# Patient Record
Sex: Male | Born: 1990 | Race: Black or African American | Hispanic: No | Marital: Single | Smoking: Never smoker
Health system: Southern US, Community
[De-identification: ages and names within clinical notes are randomized; demographics above are authoritative.]

## PROBLEM LIST (undated history)

## (undated) DIAGNOSIS — E669 Obesity, unspecified: Secondary | ICD-10-CM

## (undated) DIAGNOSIS — F840 Autistic disorder: Secondary | ICD-10-CM

## (undated) DIAGNOSIS — F952 Tourette's disorder: Secondary | ICD-10-CM

## (undated) DIAGNOSIS — I10 Essential (primary) hypertension: Secondary | ICD-10-CM

## (undated) HISTORY — DX: Tourette's disorder: F95.2

## (undated) HISTORY — DX: Essential (primary) hypertension: I10

## (undated) HISTORY — DX: Autistic disorder: F84.0

## (undated) HISTORY — DX: Obesity, unspecified: E66.9

---
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
## 2010-08-11 ENCOUNTER — Emergency Department (HOSPITAL_COMMUNITY)
Admission: EM | Admit: 2010-08-11 | Discharge: 2010-08-11 | Disposition: A | Discharge status: Home / Self Care | Payer: Medicaid Other | Attending: Emergency Medicine | Admitting: Emergency Medicine

## 2010-08-11 DIAGNOSIS — X58XXXA Exposure to other specified factors, initial encounter: Secondary | ICD-10-CM | POA: Insufficient documentation

## 2010-08-11 DIAGNOSIS — F840 Autistic disorder: Secondary | ICD-10-CM | POA: Insufficient documentation

## 2010-08-11 DIAGNOSIS — IMO0002 Reserved for concepts with insufficient information to code with codable children: Secondary | ICD-10-CM | POA: Insufficient documentation

## 2012-07-03 ENCOUNTER — Encounter: Payer: Self-pay | Admitting: *Deleted

## 2012-07-03 DIAGNOSIS — E669 Obesity, unspecified: Secondary | ICD-10-CM | POA: Insufficient documentation

## 2012-07-03 DIAGNOSIS — F840 Autistic disorder: Secondary | ICD-10-CM | POA: Insufficient documentation

## 2012-07-15 ENCOUNTER — Encounter: Payer: Self-pay | Admitting: Family Medicine

## 2013-05-19 ENCOUNTER — Encounter: Payer: Self-pay | Admitting: Family Medicine

## 2013-05-19 ENCOUNTER — Ambulatory Visit (INDEPENDENT_AMBULATORY_CARE_PROVIDER_SITE_OTHER): Payer: Medicaid Other | Admitting: Family Medicine

## 2013-05-19 VITALS — BP 126/74 | HR 80 | Temp 98.3°F | Resp 20 | Ht 75.0 in | Wt 326.0 lb

## 2013-05-19 DIAGNOSIS — Z0000 Encounter for general adult medical examination without abnormal findings: Secondary | ICD-10-CM

## 2013-05-19 MED ORDER — FLUTICASONE PROPIONATE 50 MCG/ACT NA SUSP: 2.0000 | Freq: Every day | NASAL | Status: DC

## 2013-05-19 NOTE — Progress Notes (Signed)
Subjective:    Patient ID: Erik Burton, male    DOB: [birth year], 23 y.o.   MRN: [id number]  HPI Is a 23 year old [place] male with a history of severe autism and morbid obesity. He also has a concerning family history for type 2 diabetes in both his mother and his twin sister. On his physical exam he also has acanthosis nigricans on his neck.  Otherwise he is doing well. Mom's only other concern is his allergies. He tolerates Flonase 2 sprays each nostril daily. She would prefer that over pills because it is difficult to get him to take pills. The patient is also morbidly obese weighing in at 326 pounds. His height has gone away would be approximately 210 pounds. At present he is eating large quantities of very cooperative food. He is also not getting any Regular exercise. Past Medical History  Diagnosis Date  . Autism   . Obesity    No current outpatient prescriptions on file prior to visit.   No current facility-administered medications on file prior to visit.   No Known Allergies History   Social History  . Marital Status: Single    Spouse Name: N/A    Number of Children: N/A  . Years of Education: N/A   Occupational History  . Not on file.   Social History Main Topics  . Smoking status: Never Smoker   . Smokeless tobacco: Not on file  . Alcohol Use: No  . Drug Use: Not on file  . Sexual Activity: Not on file   Other Topics Concern  . Not on file   Social History Narrative  . No narrative on file   Family History  Problem Relation Age of Onset  . Hypertension Mother   . Diabetes Mother   . Hyperlipidemia Mother   . Obesity Mother   . Hypertension Sister   . Hypertension Brother   . Diabetes Sister   . Obesity Sister       Review of Systems  All other systems reviewed and are negative.       Objective:   Physical Exam  Vitals reviewed. Constitutional: He is oriented to person, place, and time. He appears well-developed and  well-nourished. No distress.  HENT:  Right Ear: External ear normal.  Left Ear: External ear normal.  Nose: Nose normal.  Mouth/Throat: Oropharynx is clear and moist. No oropharyngeal exudate.  Eyes: Conjunctivae and EOM are normal. Pupils are equal, round, and reactive to light. Right eye exhibits no discharge. Left eye exhibits no discharge. No scleral icterus.  Neck: Normal range of motion. Neck supple. No JVD present. No thyromegaly present.  Cardiovascular: Normal rate, regular rhythm, normal heart sounds and intact distal pulses.  Exam reveals no gallop and no friction rub.   No murmur heard. Pulmonary/Chest: Effort normal and breath sounds normal. No stridor. No respiratory distress. He has no wheezes. He has no rales. He exhibits no tenderness.  Abdominal: Soft. Bowel sounds are normal. He exhibits no distension and no mass. There is no tenderness. There is no rebound and no guarding.  Musculoskeletal: Normal range of motion. He exhibits no edema and no tenderness.  Lymphadenopathy:    He has no cervical adenopathy.  Neurological: He is alert and oriented to person, place, and time. He has normal reflexes. He displays normal reflexes. No cranial nerve deficit. He exhibits normal muscle tone. Coordination normal.  Skin: Skin is warm. No rash noted. He is not diaphoretic. No erythema. No pallor.  Psychiatric: His affect is labile. He is slowed. Cognition and memory are impaired. He expresses impulsivity and inappropriate judgment. He is noncommunicative.          Assessment & Plan:  1. Routine general medical examination at a health care facility Patient's physical exam is significant for the skin changes on his neck, and his morbid obesity. Given his family history out of I could check a CBC, CMP, and fasting lipid panel. However the patient is autistic and becomes violent with any kind of immunizations or injections. Therefore we decided against this at his office visit. His mom  will try to get a fingerstick blood sugar at home and she is a diabetic and call me with the results.  If necessary we may try to give the patient Valium 10 mg 30 minutes prior to his lab draw and see if he will let us perform a venous stick to obtain the blood work in the future. I will await the results of mom's fingerstick blood sugar.

## 2013-06-23 ENCOUNTER — Telehealth: Payer: Self-pay | Admitting: Family Medicine

## 2013-06-23 NOTE — Telephone Encounter (Signed)
Pt mother is calling  To give us her sons sugar readings Wed morning 192 Thursday morning 143  Does the mother need to keep up with the sugar readings and does her son need an apt?  Call back number is [phone]

## 2013-06-26 NOTE — Telephone Encounter (Signed)
If those are fasting, they are very high.   I want a HgA1c (fingerstick here).

## 2013-06-28 NOTE — Telephone Encounter (Signed)
LMTRC

## 2013-07-24 ENCOUNTER — Encounter: Payer: Self-pay | Admitting: Family Medicine

## 2013-07-24 ENCOUNTER — Ambulatory Visit (INDEPENDENT_AMBULATORY_CARE_PROVIDER_SITE_OTHER): Payer: Medicaid Other | Admitting: Family Medicine

## 2013-07-24 VITALS — BP 120/72 | HR 80 | Temp 97.1°F | Resp 20 | Ht 75.0 in | Wt 322.0 lb

## 2013-07-24 DIAGNOSIS — R739 Hyperglycemia, unspecified: Secondary | ICD-10-CM

## 2013-07-24 DIAGNOSIS — F959 Tic disorder, unspecified: Secondary | ICD-10-CM

## 2013-07-24 DIAGNOSIS — R7309 Other abnormal glucose: Secondary | ICD-10-CM

## 2013-07-24 LAB — HEMOGLOBIN A1C, FINGERSTICK: HEMOGLOBIN A1C, FINGERSTICK: 5.2 % (ref <5.7)

## 2013-07-24 NOTE — Progress Notes (Signed)
Subjective:    Patient ID: Erik Burton, male    DOB: 06-Sep-1990, 23 y.o.   MRN: [id number]  HPI 05/19/13 Is a 23 year old [place] male with a history of severe autism and morbid obesity. He also has a concerning family history for type 2 diabetes in both his mother and his twin sister. On his physical exam he also has acanthosis nigricans on his neck.  Otherwise he is doing well. Mom's only other concern is his allergies. He tolerates Flonase 2 sprays each nostril daily. She would prefer that over pills because it is difficult to get him to take pills. The patient is also morbidly obese weighing in at 326 pounds. His height has gone away would be approximately 210 pounds. At present he is eating large quantities of very cooperative food. He is also not getting any Regular exercise.  At that time, my plan was: 1. Routine general medical examination at a health care facility Patient's physical exam is significant for the skin changes on his neck, and his morbid obesity. Given his family history out of I could check a CBC, CMP, and fasting lipid panel. However the patient is autistic and becomes violent with any kind of immunizations or injections. Therefore we decided against this at his office visit. His mom will try to get a fingerstick blood sugar at home and she is a diabetic and call me with the results.  If necessary we may try to give the patient Valium 10 mg 30 minutes prior to his lab draw and see if he will let us perform a venous stick to obtain the blood work in the future. I will await the results of mom's fingerstick blood sugar. 07/24/13 Patient is here today with his mother for followup.  mom has been checking fingerstick glucoses at home and recording values that range wildly. He is here today for a fingerstick hemoglobin A1c to assess to see if the patient truly a diabetic. His A1c today is 5.2 and normal. She also states that his verbal and physical tics have worsened. He  repeatedly makes quick, jerking motions of the head and his upper extremities. He also frequently repeats grunts and sounds inappropriately.    Past Medical History  Diagnosis Date  . Autism   . Obesity    Current Outpatient Prescriptions on File Prior to Visit  Medication Sig Dispense Refill  . fluticasone (FLONASE) 50 MCG/ACT nasal spray Place 2 sprays into both nostrils daily.  16 g  11   No current facility-administered medications on file prior to visit.   No Known Allergies History   Social History  . Marital Status: Single    Spouse Name: N/A    Number of Children: N/A  . Years of Education: N/A   Occupational History  . Not on file.   Social History Main Topics  . Smoking status: Never Smoker   . Smokeless tobacco: Not on file  . Alcohol Use: No  . Drug Use: Not on file  . Sexual Activity: Not on file   Other Topics Concern  . Not on file   Social History Narrative  . No narrative on file   Family History  Problem Relation Age of Onset  . Hypertension Mother   . Diabetes Mother   . Hyperlipidemia Mother   . Obesity Mother   . Hypertension Sister   . Hypertension Brother   . Diabetes Sister   . Obesity Sister       Review of  Systems  All other systems reviewed and are negative.       Objective:   Physical Exam  Vitals reviewed. Constitutional: He is oriented to person, place, and time. He appears well-developed and well-nourished. No distress.  HENT:  Right Ear: External ear normal.  Left Ear: External ear normal.  Nose: Nose normal.  Mouth/Throat: Oropharynx is clear and moist. No oropharyngeal exudate.  Eyes: Conjunctivae and EOM are normal. Pupils are equal, round, and reactive to light. Right eye exhibits no discharge. Left eye exhibits no discharge. No scleral icterus.  Neck: Normal range of motion. Neck supple. No JVD present. No thyromegaly present.  Cardiovascular: Normal rate, regular rhythm, normal heart sounds and intact distal  pulses.  Exam reveals no gallop and no friction rub.   No murmur heard. Pulmonary/Chest: Effort normal and breath sounds normal. No stridor. No respiratory distress. He has no wheezes. He has no rales. He exhibits no tenderness.  Abdominal: Soft. Bowel sounds are normal. He exhibits no distension and no mass. There is no tenderness. There is no rebound and no guarding.  Musculoskeletal: Normal range of motion. He exhibits no edema and no tenderness.  Lymphadenopathy:    He has no cervical adenopathy.  Neurological: He is alert and oriented to person, place, and time. He has normal reflexes. No cranial nerve deficit. He exhibits normal muscle tone. Coordination normal.  Skin: Skin is warm. No rash noted. He is not diaphoretic. No erythema. No pallor.  Psychiatric: His affect is labile. He is slowed. Cognition and memory are impaired. He expresses impulsivity and inappropriate judgment. He is noncommunicative.          Assessment & Plan:  1. Elevated blood sugar I explained the values to the patient and his mother. There is no sign of diabetes. His hemoglobin A1c is well within normal limits. Continue to encourage a low carbohydrate diet and increasing exercise. - Hemoglobin A1C, fingerstick  2. Tic disorder Patient's autism tends to have an element of Tourette's syndrome.  I would consider treating him with clonidine to help minimize some of the vertebral and motor tics. I gave the patient's mother information on the medication so she can research it. She will call me and by me know if she wants to try to start medication. We can also add an SSRI to the clonidine to help as well.  We could also consider an antipsychotic medication if his symptoms prove refractory.  Mom will consider all these options and that me know her decision.

## 2013-08-07 ENCOUNTER — Telehealth: Payer: Self-pay | Admitting: Family Medicine

## 2013-08-07 NOTE — Telephone Encounter (Signed)
Message copied by Ricard DillonWILLIS, Cannon Quinton B on Mon Aug 07, 2013 12:39 PM ------      Message from: Malvin JohnsBULLINS, SUSAN S      Created: Mon Aug 07, 2013 12:29 PM       Patients mom is calling to say that they want to go ahead and put patient on the clonidine if possible please call [phone] with questions   ------

## 2013-08-08 MED ORDER — CLONIDINE HCL ER 0.1 & 0.2 MG PO MISC: 0.1000 mg | Freq: Every day | ORAL | Status: DC

## 2013-08-08 NOTE — Telephone Encounter (Signed)
Begin kapvay 0.1 mg poqhs and call with results in 2 weeks, we can increase further at that time if needed.

## 2013-08-08 NOTE — Telephone Encounter (Signed)
Mother aware of RX and need to call provider in two weeks.

## 2013-10-03 ENCOUNTER — Telehealth: Payer: Self-pay | Admitting: Family Medicine

## 2013-10-03 NOTE — Telephone Encounter (Signed)
Okay to change to 0.2mg  at bedtime, have them com in for f/u with Dr. Tanya NonesPickard after a few weeks

## 2013-10-03 NOTE — Telephone Encounter (Signed)
He is taking .1mg  qhs and mom wants to increase it.  Per phone note 08/07/13 WTP states that we could up the dose. Ok to do?

## 2013-10-03 NOTE — Telephone Encounter (Signed)
Pt mothers states that  The dosage is needs to be more  CloNIDine HCl ER 0.1 & 0.2 MG MISC    [phone]  [facility]

## 2013-10-04 MED ORDER — CLONIDINE HCL 0.2 MG PO TABS: 0.2000 mg | ORAL_TABLET | Freq: Every day | ORAL | Status: DC

## 2013-10-04 NOTE — Telephone Encounter (Signed)
Med sent to pharm, mother aware and appt made

## 2013-10-16 ENCOUNTER — Ambulatory Visit (INDEPENDENT_AMBULATORY_CARE_PROVIDER_SITE_OTHER): Payer: Medicaid Other | Admitting: Family Medicine

## 2013-10-16 ENCOUNTER — Encounter: Payer: Self-pay | Admitting: Family Medicine

## 2013-10-16 VITALS — BP 120/74 | HR 84 | Temp 97.3°F | Resp 20 | Ht 75.0 in | Wt 316.0 lb

## 2013-10-16 DIAGNOSIS — F840 Autistic disorder: Secondary | ICD-10-CM

## 2013-10-16 DIAGNOSIS — F952 Tourette's disorder: Secondary | ICD-10-CM

## 2013-10-16 MED ORDER — FLUTICASONE PROPIONATE 50 MCG/ACT NA SUSP: 2.0000 | Freq: Every day | NASAL | Status: DC

## 2013-10-16 MED ORDER — CLONIDINE HCL 0.2 MG PO TABS: 0.2000 mg | ORAL_TABLET | Freq: Two times a day (BID) | ORAL | Status: DC

## 2013-10-16 NOTE — Progress Notes (Signed)
Subjective:    Patient ID: Erik Burton, male    DOB: [birth year], 23 y.o.   MRN: [id number]  HPI 05/19/13 Is a 23 year old [place] male with a history of severe autism and morbid obesity. He also has a concerning family history for type 2 diabetes in both his mother and his twin sister. On his physical exam he also has acanthosis nigricans on his neck.  Otherwise he is doing well. Mom's only other concern is his allergies. He tolerates Flonase 2 sprays each nostril daily. She would prefer that over pills because it is difficult to get him to take pills. The patient is also morbidly obese weighing in at 326 pounds. His height has gone away would be approximately 210 pounds. At present he is eating large quantities of very cooperative food. He is also not getting any Regular exercise.  At that time, my plan was: 1. Routine general medical examination at a health care facility Patient's physical exam is significant for the skin changes on his neck, and his morbid obesity. Given his family history out of I could check a CBC, CMP, and fasting lipid panel. However the patient is autistic and becomes violent with any kind of immunizations or injections. Therefore we decided against this at his office visit. His mom will try to get a fingerstick blood sugar at home and she is a diabetic and call me with the results.  If necessary we may try to give the patient Valium 10 mg 30 minutes prior to his lab draw and see if he will let us perform a venous stick to obtain the blood work in the future. I will await the results of mom's fingerstick blood sugar. 07/24/13 Patient is here today with his mother for followup.  mom has been checking fingerstick glucoses at home and recording values that range wildly. He is here today for a fingerstick hemoglobin A1c to assess to see if the patient truly a diabetic. His A1c today is 5.2 and normal. She also states that his verbal and physical tics have worsened. He  repeatedly makes quick, jerking motions of the head and his upper extremities. He also frequently repeats grunts and sounds inappropriately.  At that time, my plan was: 1. Elevated blood sugar I explained the values to the patient and his mother. There is no sign of diabetes. His hemoglobin A1c is well within normal limits. Continue to encourage a low carbohydrate diet and increasing exercise. - Hemoglobin A1C, fingerstick  2. Tic disorder Patient's autism tends to have an element of Tourette's syndrome.  I would consider treating him with clonidine to help minimize some of the vertebral and motor tics. I gave the patient's mother information on the medication so she can research it. She will call me and by me know if she wants to try to start medication. We can also add an SSRI to the clonidine to help as well.  We could also consider an antipsychotic medication if his symptoms prove refractory.  Mom will consider all these options and that me know her decision.   10/16/13 Patient was initially started on clonidine 0.1 mg by mouth each bedtime. This seemed to help calm her agitation at night. We'll subsequently increase clonidine to 0.2 mg by mouth each bedtime. This seems to help but it does not last throughout the day. He continues to have a lot of motor tics. He also continues to have a lot of verbal outburst. Mother is interested in other options for  controlling some of the symptoms of his autism disorder.  Past Medical History  Diagnosis Date  . Autism   . Obesity   . Tourette disorder    Current Outpatient Prescriptions on File Prior to Visit  Medication Sig Dispense Refill  . CloNIDine HCl ER 0.1 & 0.2 MG MISC Take 0.1 mg by mouth at bedtime.  30 each  0  . fluticasone (FLONASE) 50 MCG/ACT nasal spray Place 2 sprays into both nostrils daily.  16 g  11   No current facility-administered medications on file prior to visit.   No Known Allergies History   Social History  . Marital  Status: Single    Spouse Name: N/A    Number of Children: N/A  . Years of Education: N/A   Occupational History  . Not on file.   Social History Main Topics  . Smoking status: Never Smoker   . Smokeless tobacco: Not on file  . Alcohol Use: No  . Drug Use: Not on file  . Sexual Activity: Not on file   Other Topics Concern  . Not on file   Social History Narrative  . No narrative on file   Family History  Problem Relation Age of Onset  . Hypertension Mother   . Diabetes Mother   . Hyperlipidemia Mother   . Obesity Mother   . Hypertension Sister   . Hypertension Brother   . Diabetes Sister   . Obesity Sister       Review of Systems  All other systems reviewed and are negative.      Objective:   Physical Exam  Vitals reviewed. Constitutional: He is oriented to person, place, and time. He appears well-developed and well-nourished. No distress.  HENT:  Right Ear: External ear normal.  Left Ear: External ear normal.  Nose: Nose normal.  Mouth/Throat: Oropharynx is clear and moist. No oropharyngeal exudate.  Eyes: Conjunctivae and EOM are normal. Pupils are equal, round, and reactive to light. Right eye exhibits no discharge. Left eye exhibits no discharge. No scleral icterus.  Neck: Normal range of motion. Neck supple. No JVD present. No thyromegaly present.  Cardiovascular: Normal rate, regular rhythm, normal heart sounds and intact distal pulses.  Exam reveals no gallop and no friction rub.   No murmur heard. Pulmonary/Chest: Effort normal and breath sounds normal. No stridor. No respiratory distress. He has no wheezes. He has no rales. He exhibits no tenderness.  Abdominal: Soft. Bowel sounds are normal. He exhibits no distension and no mass. There is no tenderness. There is no rebound and no guarding.  Musculoskeletal: Normal range of motion. He exhibits no edema and no tenderness.  Lymphadenopathy:    He has no cervical adenopathy.  Neurological: He is alert  and oriented to person, place, and time. He has normal reflexes. No cranial nerve deficit. He exhibits normal muscle tone. Coordination normal.  Skin: Skin is warm. No rash noted. He is not diaphoretic. No erythema. No pallor.  Psychiatric: His affect is labile. He is slowed. Cognition and memory are impaired. He expresses impulsivity and inappropriate judgment. He is noncommunicative.          Assessment & Plan:  1. Tourette disorder - cloNIDine (CATAPRES) 0.2 MG tablet; Take 1 tablet (0.2 mg total) by mouth 2 (two) times daily.  Dispense: 60 tablet; Refill: 5  2. Autism disorder  Increase clonidine to 0.2 mg by mouth twice a day. Monitor for low blood pressure. Also discussed possibly trying the patient on risperdal.  However I would recommend a psychiatry consultation prior to initiating that medication.

## 2015-04-22 ENCOUNTER — Emergency Department (HOSPITAL_COMMUNITY): Payer: Medicaid Other

## 2015-04-22 ENCOUNTER — Encounter (HOSPITAL_COMMUNITY): Payer: Self-pay | Admitting: Emergency Medicine

## 2015-04-22 ENCOUNTER — Emergency Department (HOSPITAL_COMMUNITY)
Admission: EM | Admit: 2015-04-22 | Discharge: 2015-04-22 | Disposition: A | Discharge status: Home / Self Care | Payer: Medicaid Other | Attending: Emergency Medicine | Admitting: Emergency Medicine

## 2015-04-22 DIAGNOSIS — F840 Autistic disorder: Secondary | ICD-10-CM | POA: Diagnosis not present

## 2015-04-22 DIAGNOSIS — Y998 Other external cause status: Secondary | ICD-10-CM | POA: Insufficient documentation

## 2015-04-22 DIAGNOSIS — X58XXXA Exposure to other specified factors, initial encounter: Secondary | ICD-10-CM | POA: Insufficient documentation

## 2015-04-22 DIAGNOSIS — S99921A Unspecified injury of right foot, initial encounter: Secondary | ICD-10-CM | POA: Diagnosis present

## 2015-04-22 DIAGNOSIS — Z7951 Long term (current) use of inhaled steroids: Secondary | ICD-10-CM | POA: Insufficient documentation

## 2015-04-22 DIAGNOSIS — Z79899 Other long term (current) drug therapy: Secondary | ICD-10-CM | POA: Diagnosis not present

## 2015-04-22 DIAGNOSIS — S93401A Sprain of unspecified ligament of right ankle, initial encounter: Secondary | ICD-10-CM

## 2015-04-22 DIAGNOSIS — Y9389 Activity, other specified: Secondary | ICD-10-CM | POA: Insufficient documentation

## 2015-04-22 DIAGNOSIS — E669 Obesity, unspecified: Secondary | ICD-10-CM | POA: Insufficient documentation

## 2015-04-22 DIAGNOSIS — Y9289 Other specified places as the place of occurrence of the external cause: Secondary | ICD-10-CM | POA: Diagnosis not present

## 2015-04-22 MED ORDER — IBUPROFEN 800 MG PO TABS
800.0000 mg | ORAL_TABLET | Freq: Once | ORAL | Status: AC
  Administered 2015-04-22: 800 mg via ORAL
  Filled 2015-04-22: qty 1

## 2015-04-22 MED ORDER — IBUPROFEN 800 MG PO TABS: 800.0000 mg | ORAL_TABLET | Freq: Three times a day (TID) | ORAL | Status: DC

## 2015-04-22 MED ORDER — HYDROCODONE-ACETAMINOPHEN 5-325 MG PO TABS: ORAL_TABLET | ORAL | Status: DC

## 2015-04-22 NOTE — Discharge Instructions (Signed)
Ankle Sprain °An ankle sprain is an injury to the strong, fibrous tissues (ligaments) that hold your ankle bones together.  °HOME CARE  °· Put ice on your ankle for 1-2 days or as told by your doctor. °¨ Put ice in a plastic bag. °¨ Place a towel between your skin and the bag. °¨ Leave the ice on for 15-20 minutes at a time, every 2 hours while you are awake. °· Only take medicine as told by your doctor. °· Raise (elevate) your injured ankle above the level of your heart as much as possible for 2-3 days. °· Use crutches if your doctor tells you to. Slowly put your own weight on the affected ankle. Use the crutches until you can walk without pain. °· If you have a plaster splint: °¨ Do not rest it on anything harder than a pillow for 24 hours. °¨ Do not put weight on it. °¨ Do not get it wet. °¨ Take it off to shower or bathe. °· If given, use an elastic wrap or support stocking for support. Take the wrap off if your toes lose feeling (numb), tingle, or turn cold or blue. °· If you have an air splint: °¨ Add or let out air to make it comfortable. °¨ Take it off at night and to shower and bathe. °¨ Wiggle your toes and move your ankle up and down often while you are wearing it. °GET HELP IF: °· You have rapidly increasing bruising or puffiness (swelling). °· Your toes feel very cold. °· You lose feeling in your foot. °· Your medicine does not help your pain. °GET HELP RIGHT AWAY IF:  °· Your toes lose feeling (numb) or turn blue. °· You have severe pain that is increasing. °MAKE SURE YOU:  °· Understand these instructions. °· Will watch your condition. °· Will get help right away if you are not doing well or get worse. °  °This information is not intended to replace advice given to you by your health care provider. Make sure you discuss any questions you have with your health care provider. °  °Document Released: 09/23/2007 Document Revised: 04/27/2014 Document Reviewed: 10/19/2011 °Elsevier Interactive Patient  Education ©2016 [employer] ° °

## 2015-04-22 NOTE — ED Notes (Signed)
Mother states patient's right ankle started swelling yesterday. Denies injury. Patient has autism and is unable to answer questions. Patient ambulatory with slight limp at triage.

## 2015-04-25 ENCOUNTER — Other Ambulatory Visit: Payer: Self-pay | Admitting: Family Medicine

## 2015-04-25 NOTE — Telephone Encounter (Signed)
Refill appropriate and filled per protocol. 

## 2015-04-27 NOTE — ED Provider Notes (Signed)
CSN: [id number]     Arrival date & time 04/22/15  1846 History   First MD Initiated Contact with Patient 04/22/15 2015     Chief Complaint  Patient presents with  . Joint Swelling     (Consider location/radiation/quality/duration/timing/severity/associated sxs/prior Treatment) HPI   Erik Burton is a 25 y.o. male with hx of autism presents to the Emergency Department with his mother who states patient has been limping and complaining of right foot pain for one day.  Mother states that she noticed he was limping on the day prior to arrival.  She states that he wore a different pair of shoes recently.  She is unsure of known injury.  She has not given any medications or tried any  Therapies for his symtpoms     Past Medical History  Diagnosis Date  . Autism   . Obesity   . Tourette disorder    History reviewed. No pertinent past surgical history. Family History  Problem Relation Age of Onset  . Hypertension Mother   . Diabetes Mother   . Hyperlipidemia Mother   . Obesity Mother   . Hypertension Sister   . Hypertension Brother   . Diabetes Sister   . Obesity Sister    Social History  Substance Use Topics  . Smoking status: Never Smoker   . Smokeless tobacco: None  . Alcohol Use: No    Review of Systems  Unable to perform ROS: Patient nonverbal (pt has hx of autism)      Allergies  Review of patient's allergies indicates no known allergies.  Home Medications   Prior to Admission medications   Medication Sig Start Date End Date Taking? Authorizing Provider  cloNIDine (CATAPRES) 0.2 MG tablet TAKE 1 TABLET BY MOUTH TWICE DAILY. 04/25/15   Donita BrooksWarren T Pickard, MD  CloNIDine HCl ER 0.1 & 0.2 MG MISC Take 0.1 mg by mouth at bedtime. 08/08/13   Donita BrooksWarren T Pickard, MD  fluticasone (FLONASE) 50 MCG/ACT nasal spray Place 2 sprays into both nostrils daily. 05/19/13   Donita BrooksWarren T Pickard, MD  fluticasone (FLONASE) 50 MCG/ACT nasal spray Place 2 sprays into both nostrils daily.  10/16/13   Donita BrooksWarren T Pickard, MD  HYDROcodone-acetaminophen (NORCO/VICODIN) 5-325 MG tablet Take one tab po q 4-6 hrs prn pain 04/22/15   Regana Kemple, PA-C  ibuprofen (ADVIL,MOTRIN) 800 MG tablet Take 1 tablet (800 mg total) by mouth 3 (three) times daily. 04/22/15   Kiptyn Rafuse, PA-C   Pulse 81  Temp(Src)   Resp 24  Ht 6' 3.5" (1.918 m)  Wt 164.429 kg  BMI 44.70 kg/m2  SpO2 100% Physical Exam  Constitutional: He is oriented to person, place, and time. He appears well-developed and well-nourished. No distress.  HENT:  Head: Normocephalic and atraumatic.  Cardiovascular: Normal rate, regular rhythm and intact distal pulses.   Pulmonary/Chest: Effort normal and breath sounds normal.  Musculoskeletal: He exhibits tenderness. He exhibits no edema.  Pt grimacing and withdraws his foot on palpation of the lateral hind foot and lateral ankle.  No edema.   DP pulse is brisk,distal sensation intact.  No erythema, abrasion, bruising or bony deformity.  No proximal tenderness.  Neurological: He is alert and oriented to person, place, and time. He exhibits normal muscle tone. Coordination normal.  Skin: Skin is warm and dry.  Nursing note and vitals reviewed.   ED Course  Procedures (including critical care time) Labs Review Labs Reviewed - No data to display  Imaging Review Dg Ankle Complete  Right  04/22/2015  CLINICAL DATA:  Right ankle pain and swelling, acute onset. Initial encounter. EXAM: RIGHT ANKLE - COMPLETE 3+ VIEW COMPARISON:  None. FINDINGS: There is no evidence of fracture or dislocation. The ankle mortise is intact; the interosseous space is within normal limits. No talar tilt or subluxation is seen. The joint spaces are preserved. Soft tissue swelling is noted about the ankle. IMPRESSION: No evidence of fracture or dislocation. Electronically Signed   By: Roanna Raider M.D.   On: 04/22/2015 21:45   Dg Foot Complete Right  04/22/2015  CLINICAL DATA:  Redness and swelling to lateral  Rt foot x 2 days. Unsure of any injury. Pt did recently get new shoes. Hx autism EXAM: RIGHT FOOT COMPLETE - 3+ VIEW COMPARISON:  None. FINDINGS: No acute fracture or dislocation. No definite soft tissue swelling. No radiopaque foreign object. IMPRESSION: No acute osseous abnormality. Electronically Signed   By: Jeronimo Greaves M.D.   On: 04/22/2015 19:48    I have personally reviewed and evaluated these images and lab results as part of my medical decision-making.   EKG Interpretation None      MDM   Final diagnoses:  Ankle sprain, right, initial encounter    XR reassuring.  NV intact.  Compartments soft.    ASO applied.  Mother agrees to RICE therapy and close PMD or ortho f/u.  Also agrees to return here if worsening sx's   Pauline Aus, PA-C 04/27/15 0109  Bethann Berkshire, MD 04/29/15 [phone]

## 2015-04-29 MED FILL — Hydrocodone-Acetaminophen Tab 5-325 MG: ORAL | Qty: 6 | Status: AC

## 2015-07-26 ENCOUNTER — Encounter: Payer: Self-pay | Admitting: Family Medicine

## 2015-07-26 ENCOUNTER — Ambulatory Visit (INDEPENDENT_AMBULATORY_CARE_PROVIDER_SITE_OTHER): Payer: Medicaid Other | Admitting: Family Medicine

## 2015-07-26 VITALS — BP 126/82 | HR 86 | Temp 98.1°F | Resp 20 | Ht 75.0 in | Wt 361.0 lb

## 2015-07-26 DIAGNOSIS — R938 Abnormal findings on diagnostic imaging of other specified body structures: Secondary | ICD-10-CM | POA: Diagnosis not present

## 2015-07-26 DIAGNOSIS — F840 Autistic disorder: Secondary | ICD-10-CM

## 2015-07-26 DIAGNOSIS — H579 Unspecified disorder of eye and adnexa: Secondary | ICD-10-CM

## 2015-07-26 DIAGNOSIS — F952 Tourette's disorder: Secondary | ICD-10-CM | POA: Diagnosis not present

## 2015-07-26 MED ORDER — IBUPROFEN 800 MG PO TABS: 800.0000 mg | ORAL_TABLET | Freq: Three times a day (TID) | ORAL | Status: DC

## 2015-07-26 MED ORDER — CLONIDINE HCL 0.2 MG PO TABS: 0.2000 mg | ORAL_TABLET | Freq: Two times a day (BID) | ORAL | Status: DC

## 2015-07-26 MED ORDER — FLUTICASONE PROPIONATE 50 MCG/ACT NA SUSP: 2.0000 | Freq: Every day | NASAL | Status: DC

## 2015-07-29 ENCOUNTER — Encounter: Payer: Self-pay | Admitting: Family Medicine

## 2015-07-29 NOTE — Progress Notes (Signed)
Subjective:    Patient ID: Erik Burton, male    DOB: [birth year], 25 y.o.   MRN: [id number]  HPI 05/19/13 Is a 25 year old [place] American male with a history of severe autism and morbid obesity. He also has a concerning family history for type 2 diabetes in both his mother and his twin sister. On his physical exam he also has acanthosis nigricans on his neck.  Otherwise he is doing well. Mom's only other concern is his allergies. He tolerates Flonase 2 sprays each nostril daily. She would prefer that over pills because it is difficult to get him to take pills. The patient is also morbidly obese weighing in at 326 pounds. His goal weight would be approximately 210 pounds. At present he is eating large quantities of junk food. He is also not getting any Regular exercise.  At that time, my plan was: 1. Routine general medical examination at a health care facility Patient's physical exam is significant for the skin changes on his neck, and his morbid obesity. Given his family history out of I could check a CBC, CMP, and fasting lipid panel. However the patient is autistic and becomes violent with any kind of immunizations or injections. Therefore we decided against this at his office visit. His mom will try to get a fingerstick blood sugar at home and she is a diabetic and call me with the results.  If necessary we may try to give the patient Valium 10 mg 30 minutes prior to his lab draw and see if he will let us perform a venous stick to obtain the blood work in the future. I will await the results of mom's fingerstick blood sugar. 07/24/13 Patient is here today with his mother for followup.  mom has been checking fingerstick glucoses at home and recording values that range wildly. He is here today for a fingerstick hemoglobin A1c to assess to see if the patient truly a diabetic. His A1c today is 5.2 and normal. She also states that his verbal and physical tics have worsened. He repeatedly makes  quick, jerking motions of the head and his upper extremities. He also frequently repeats grunts and sounds inappropriately.  At that time, my plan was: 1. Elevated blood sugar I explained the values to the patient and his mother. There is no sign of diabetes. His hemoglobin A1c is well within normal limits. Continue to encourage a low carbohydrate diet and increasing exercise. - Hemoglobin A1C, fingerstick  2. Tic disorder Patient's autism tends to have an element of Tourette's syndrome.  I would consider treating him with clonidine to help minimize some of the vertebral and motor tics. I gave the patient's mother information on the medication so she can research it. She will call me and by me know if she wants to try to start medication. We can also add an SSRI to the clonidine to help as well.  We could also consider an antipsychotic medication if his symptoms prove refractory.  Mom will consider all these options and that me know her decision.   10/16/13 Patient was initially started on clonidine 0.1 mg by mouth each bedtime. This seemed to help calm her agitation at night. We'll subsequently increase clonidine to 0.2 mg by mouth each bedtime. This seems to help but it does not last throughout the day. He continues to have a lot of motor tics. He also continues to have a lot of verbal outburst. Mother is interested in other options for controlling some of  the symptoms of his autism disorder.  At that time,my plan was: 1. Tourette disorder - cloNIDine (CATAPRES) 0.2 MG tablet; Take 1 tablet (0.2 mg total) by mouth 2 (two) times daily.  Dispense: 60 tablet; Refill: 5  2. Autism disorder  Increase clonidine to 0.2 mg by mouth twice a day. Monitor for low blood pressure. Also discussed possibly trying the patient on risperdal.  However I would recommend a psychiatry consultation prior to initiating that medication.  07/29/15 Here today for follow-up. Had an eye exam performed at an optometrist. They  were concerned that there was an abnormality on his retinal exam. However due to his Tourette's and autism they were unable to perform an adequate exam. They've recommended that the patient see Dr. Verne Carrow for a formal ophthalmologic exam. Otherwise mom states that he is doing okay. He remains noncommunicative. He has frequent verbal tics and outburst with grunts and moans. He has gained substantial weight since his last office visit. Wt Readings from Last 3 Encounters:  07/26/15 361 lb (163.749 kg)  04/22/15 362 lb 8 oz (164.429 kg)  10/16/13 316 lb (143.337 kg)   Mom admits that he is not getting any exercise. She also admits that he eats a lot of carbohydrates drinks Kool-Aid and eats fast food. She also requests a prescription for adult diapers and bed pads as the patient is occasionally incontinent at night. Mom denies any psychotic behavior. She denies any violent behavior. Past Medical History  Diagnosis Date  . Autism   . Obesity   . Tourette disorder    Current Outpatient Prescriptions on File Prior to Visit  Medication Sig Dispense Refill  . CloNIDine HCl ER 0.1 & 0.2 MG MISC Take 0.1 mg by mouth at bedtime. (Patient not taking: Reported on 07/26/2015) 30 each 0   No current facility-administered medications on file prior to visit.   No Known Allergies Social History   Social History  . Marital Status: Single    Spouse Name: N/A  . Number of Children: N/A  . Years of Education: N/A   Occupational History  . Not on file.   Social History Main Topics  . Smoking status: Never Smoker   . Smokeless tobacco: Not on file  . Alcohol Use: No  . Drug Use: No  . Sexual Activity: Not on file   Other Topics Concern  . Not on file   Social History Narrative   Family History  Problem Relation Age of Onset  . Hypertension Mother   . Diabetes Mother   . Hyperlipidemia Mother   . Obesity Mother   . Hypertension Sister   . Hypertension Brother   . Diabetes Sister   .  Obesity Sister       Review of Systems  All other systems reviewed and are negative.      Objective:   Physical Exam  Constitutional: He is oriented to person, place, and time. He appears well-developed and well-nourished. No distress.  HENT:  Right Ear: External ear normal.  Left Ear: External ear normal.  Nose: Nose normal.  Mouth/Throat: Oropharynx is clear and moist. No oropharyngeal exudate.  Eyes: Conjunctivae and EOM are normal. Pupils are equal, round, and reactive to light. Right eye exhibits no discharge. Left eye exhibits no discharge. No scleral icterus.  Neck: Normal range of motion. Neck supple. No JVD present. No thyromegaly present.  Cardiovascular: Normal rate, regular rhythm, normal heart sounds and intact distal pulses.  Exam reveals no gallop and no  friction rub.   No murmur heard. Pulmonary/Chest: Effort normal and breath sounds normal. No stridor. No respiratory distress. He has no wheezes. He has no rales. He exhibits no tenderness.  Abdominal: Soft. Bowel sounds are normal. He exhibits no distension and no mass. There is no tenderness. There is no rebound and no guarding.  Musculoskeletal: Normal range of motion. He exhibits no edema or tenderness.  Lymphadenopathy:    He has no cervical adenopathy.  Neurological: He is alert and oriented to person, place, and time. He has normal reflexes. No cranial nerve deficit. He exhibits normal muscle tone. Coordination normal.  Skin: Skin is warm. No rash noted. He is not diaphoretic. No erythema. No pallor.  Psychiatric: His affect is labile. He is slowed. Cognition and memory are impaired. He expresses impulsivity and inappropriate judgment. He is noncommunicative.  Vitals reviewed.         Assessment & Plan:  Eye exam abnormal - Plan: Ambulatory referral to Ophthalmology  Tourette disorder  Autism disorder  Continue clonidine 0.2 mg by mouth twice a day. We had a long discussion regarding low carbohydrate  low calorie diet with lots of fresh fruits and vegetables. I recommended avoidance of all sodas, juices, sugary beverages. I recommended 30 minutes a day of aerobic exercise. I will schedule the patient for formal eye exam.

## 2015-12-20 ENCOUNTER — Telehealth: Payer: Self-pay | Admitting: *Deleted

## 2015-12-20 MED ORDER — IBUPROFEN 800 MG PO TABS: 800.0000 mg | ORAL_TABLET | Freq: Three times a day (TID) | ORAL | 0 refills | Status: DC

## 2015-12-20 NOTE — Telephone Encounter (Signed)
Received call from patient mother.   Requesting refill on IBU for foot pain.   Ok to refill?

## 2015-12-20 NOTE — Telephone Encounter (Signed)
ok 

## 2015-12-20 NOTE — Telephone Encounter (Signed)
Prescription sent to pharmacy.

## 2016-02-19 IMAGING — DX DG FOOT COMPLETE 3+V*R*
3 series · 3 of 3 positions shown · non-contrast
Comparison: None.

CLINICAL DATA: Redness and swelling to lateral Rt foot x 2 days.
Unsure of any injury. Pt did recently get new shoes. Hx autism

EXAM:
RIGHT FOOT COMPLETE - 3+ VIEW

[foot ap]
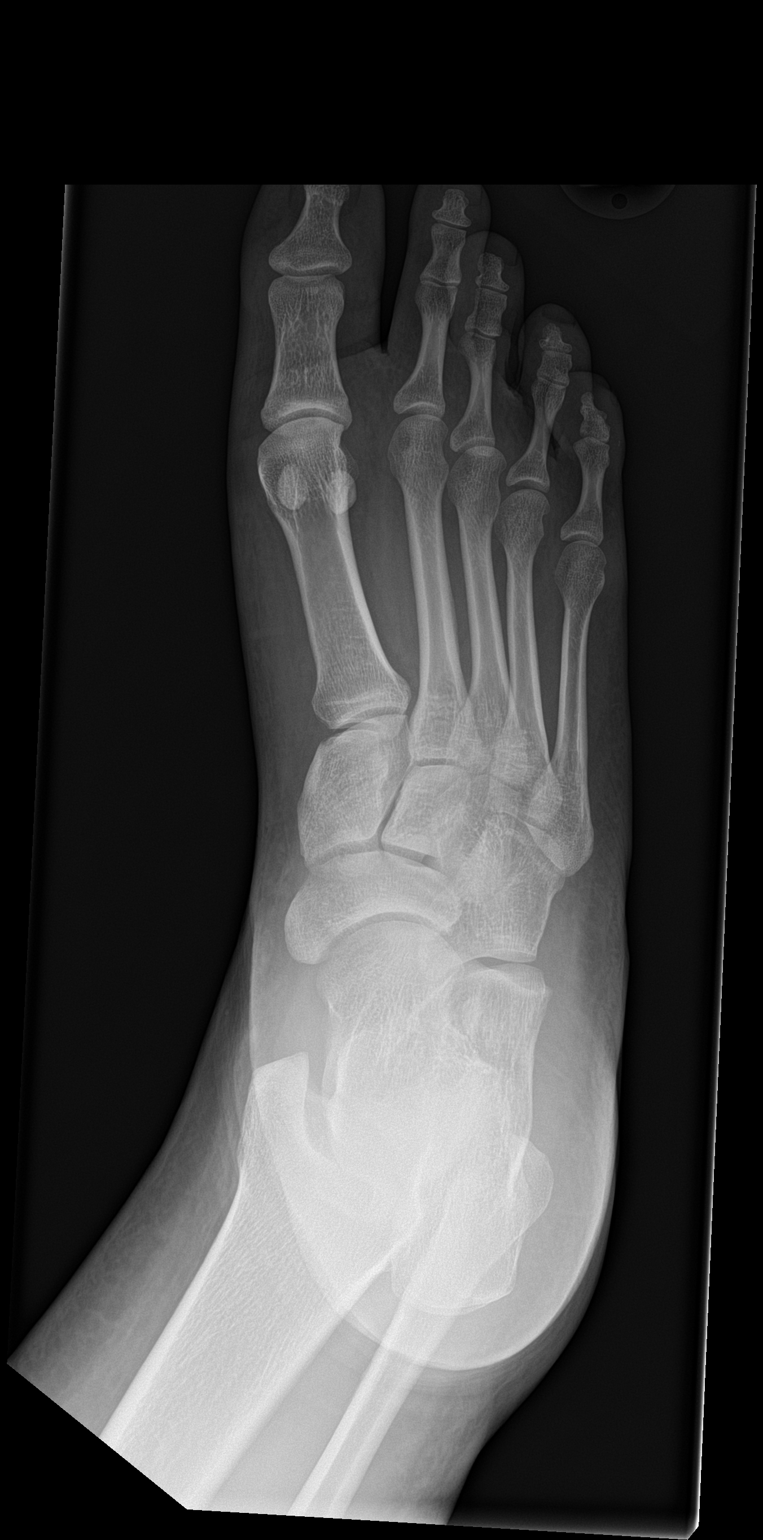

[foot obl]
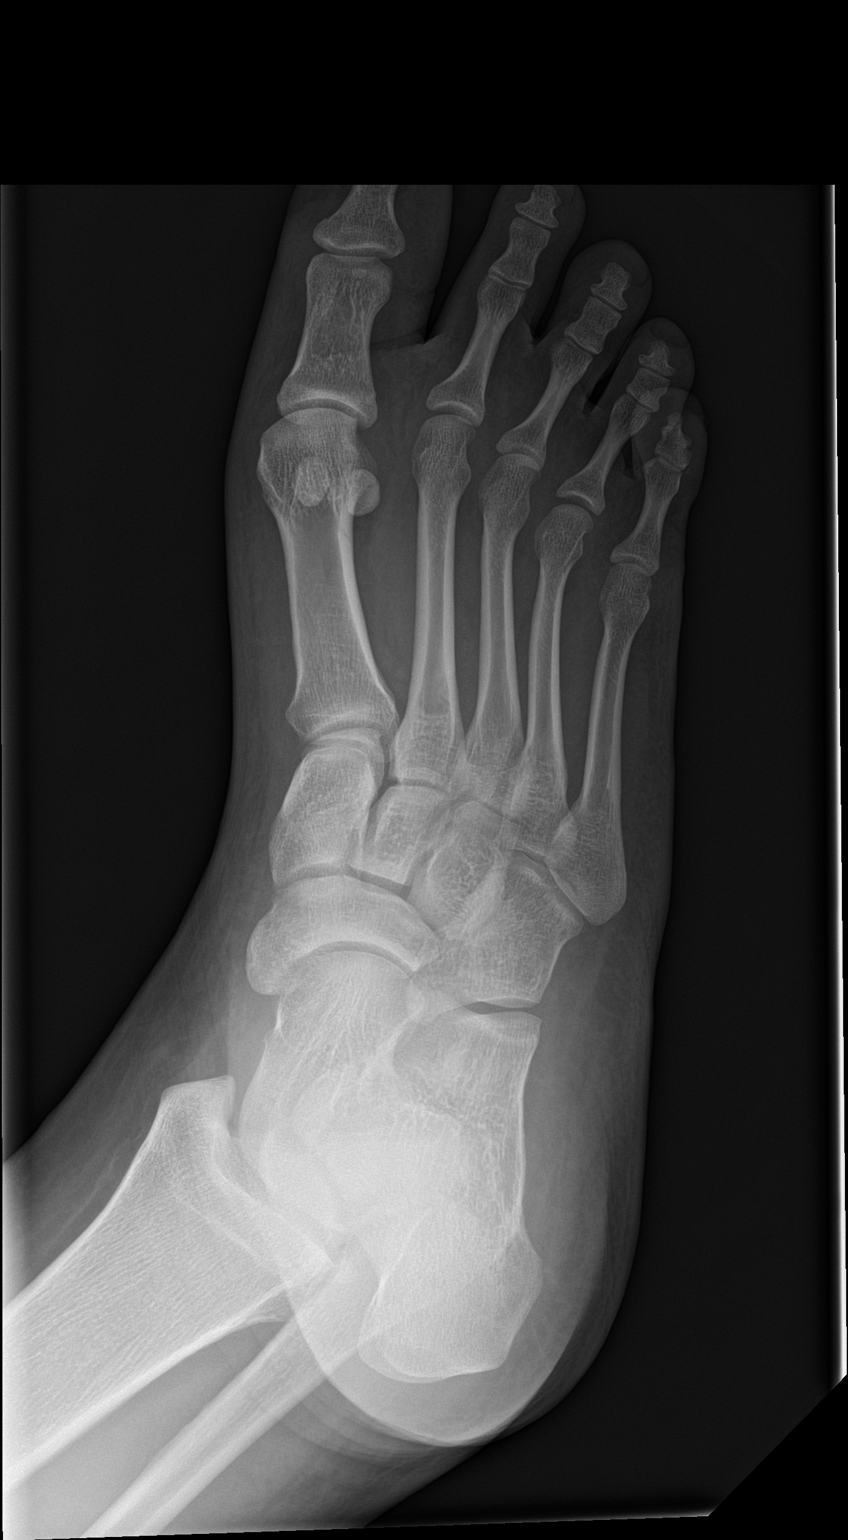

[foot lat]
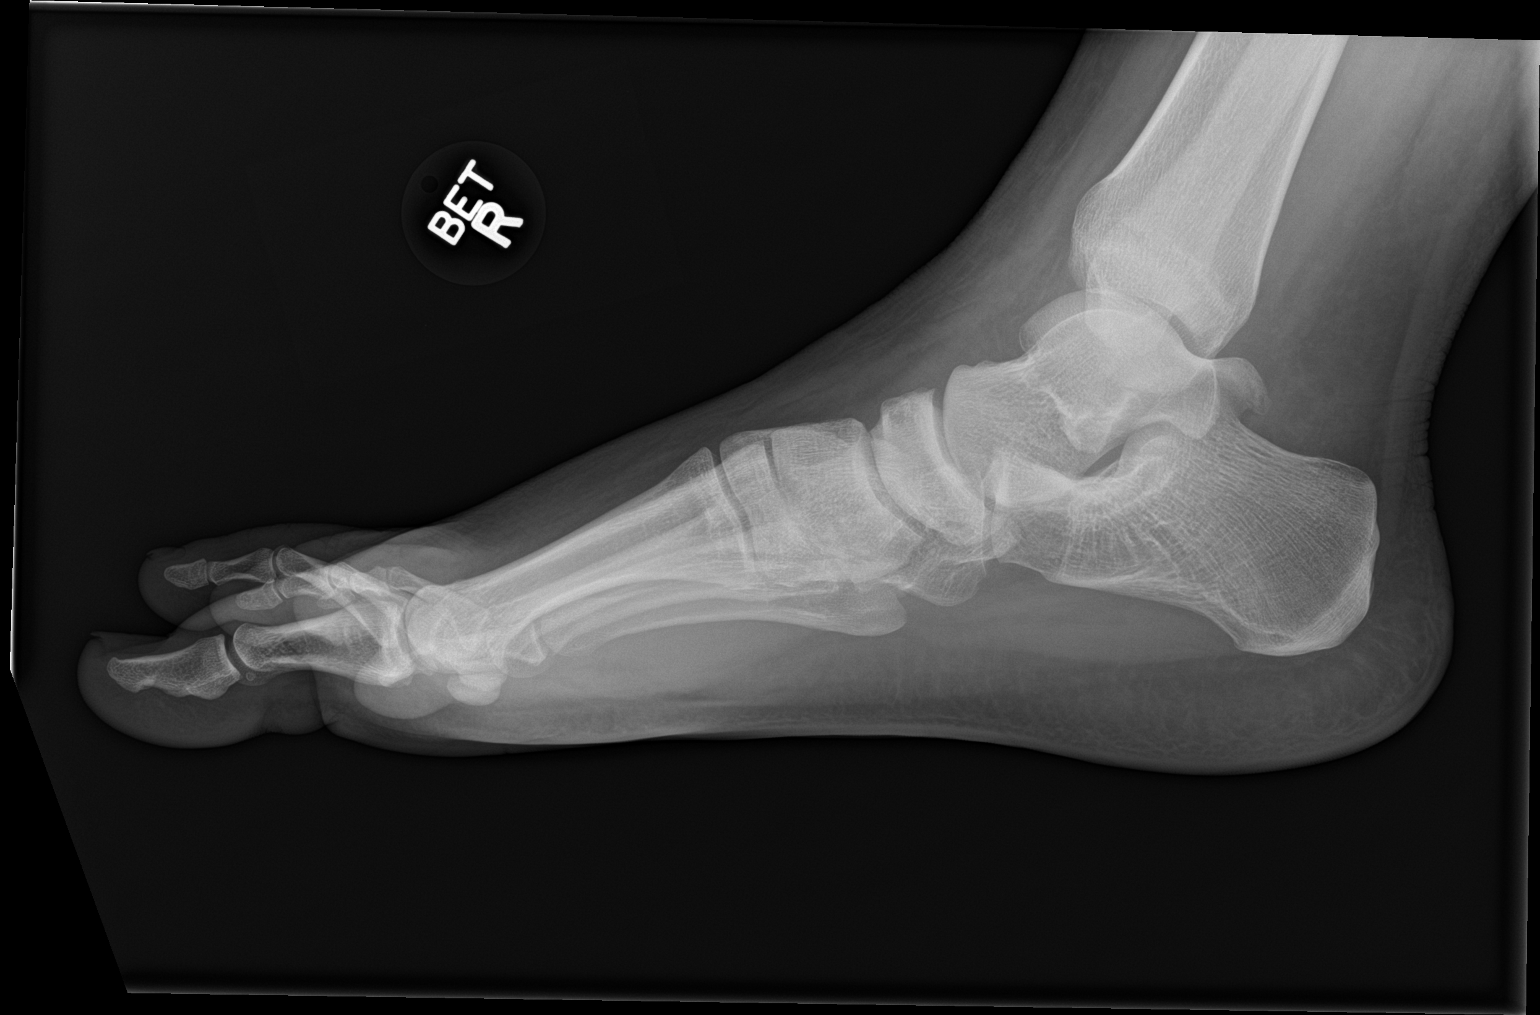

[3 of 3 positions shown; findings below may reference images not displayed]

FINDINGS: No acute fracture or dislocation. No definite soft tissue swelling.
No radiopaque foreign object.
IMPRESSION: No acute osseous abnormality.

## 2016-07-23 ENCOUNTER — Ambulatory Visit: Payer: Medicaid Other | Admitting: Family Medicine

## 2016-07-27 ENCOUNTER — Ambulatory Visit (INDEPENDENT_AMBULATORY_CARE_PROVIDER_SITE_OTHER): Payer: Medicaid Other | Admitting: Family Medicine

## 2016-07-27 ENCOUNTER — Encounter: Payer: Self-pay | Admitting: Family Medicine

## 2016-07-27 VITALS — BP 122/88 | HR 96 | Resp 18 | Wt 372.0 lb

## 2016-07-27 DIAGNOSIS — F952 Tourette's disorder: Secondary | ICD-10-CM | POA: Diagnosis not present

## 2016-07-27 MED ORDER — FLUVOXAMINE MALEATE 50 MG PO TABS
50.0000 mg | ORAL_TABLET | Freq: Every day | ORAL | 5 refills | Status: DC
Start: 2016-07-27 — End: 2017-11-04

## 2016-07-27 NOTE — Progress Notes (Signed)
Subjective:    Patient ID: Erik Burton, male    DOB: [birth year], 26 y.o.   MRN: [id number]  HPI1/30/15 Is a 26 year old [place] American male with a history of severe autism and morbid obesity. He also has a concerning family history for type 2 diabetes in both his mother and his twin sister. On his physical exam he also has acanthosis nigricans on his neck.  Otherwise he is doing well. Mom's only other concern is his allergies. He tolerates Flonase 2 sprays each nostril daily. She would prefer that over pills because it is difficult to get him to take pills. The patient is also morbidly obese weighing in at 326 pounds. His goal weight would be approximately 210 pounds. At present he is eating large quantities of junk food. He is also not getting any Regular exercise.  At that time, my plan was: 1. Routine general medical examination at a health care facility Patient's physical exam is significant for the skin changes on his neck, and his morbid obesity. Given his family history out of I could check a CBC, CMP, and fasting lipid panel. However the patient is autistic and becomes violent with any kind of immunizations or injections. Therefore we decided against this at his office visit. His mom will try to get a fingerstick blood sugar at home and she is a diabetic and call me with the results.  If necessary we may try to give the patient Valium 10 mg 30 minutes prior to his lab draw and see if he will let us perform a venous stick to obtain the blood work in the future. I will await the results of mom's fingerstick blood sugar. 07/24/13 Patient is here today with his mother for followup.  mom has been checking fingerstick glucoses at home and recording values that range wildly. He is here today for a fingerstick hemoglobin A1c to assess to see if the patient truly a diabetic. His A1c today is 5.2 and normal. She also states that his verbal and physical tics have worsened. He repeatedly makes quick,  jerking motions of the head and his upper extremities. He also frequently repeats grunts and sounds inappropriately.  At that time, my plan was: 1. Elevated blood sugar I explained the values to the patient and his mother. There is no sign of diabetes. His hemoglobin A1c is well within normal limits. Continue to encourage a low carbohydrate diet and increasing exercise. - Hemoglobin A1C, fingerstick  2. Tic disorder Patient's autism tends to have an element of Tourette's syndrome.  I would consider treating him with clonidine to help minimize some of the vertebral and motor tics. I gave the patient's mother information on the medication so she can research it. She will call me and by me know if she wants to try to start medication. We can also add an SSRI to the clonidine to help as well.  We could also consider an antipsychotic medication if his symptoms prove refractory.  Mom will consider all these options and that me know her decision.   10/16/13 Patient was initially started on clonidine 0.1 mg by mouth each bedtime. This seemed to help calm her agitation at night. We'll subsequently increase clonidine to 0.2 mg by mouth each bedtime. This seems to help but it does not last throughout the day. He continues to have a lot of motor tics. He also continues to have a lot of verbal outburst. Mother is interested in other options for controlling some of the  symptoms of his autism disorder.  At that time,my plan was: 1. Tourette disorder - cloNIDine (CATAPRES) 0.2 MG tablet; Take 1 tablet (0.2 mg total) by mouth 2 (two) times daily.  Dispense: 60 tablet; Refill: 5  2. Autism disorder  Increase clonidine to 0.2 mg by mouth twice a day. Monitor for low blood pressure. Also discussed possibly trying the patient on risperdal.  However I would recommend a psychiatry consultation prior to initiating that medication.  07/27/16 Mom states the motor tics seem to be worsening. He is having more wild gestures with  his arms. He's having more focal outburst. She is interested in other options to try to help manage this other than the clonidine. Mom denies any violent behavior. Obesity is a complicating issue for this patient therefore will try to avoid Risperdal up to this point. Mom does report some compulsive behavior and repetitive behavior associated with his autism. This leaves me to believe that Luvox me may be an option. Past Medical History:  Diagnosis Date  . Autism   . Obesity   . Tourette disorder    Current Outpatient Prescriptions on File Prior to Visit  Medication Sig Dispense Refill  . cloNIDine (CATAPRES) 0.2 MG tablet Take 1 tablet (0.2 mg total) by mouth 2 (two) times daily. 60 tablet 5  . fluticasone (FLONASE) 50 MCG/ACT nasal spray Place 2 sprays into both nostrils daily. 16 g 11  . CloNIDine HCl ER 0.1 & 0.2 MG MISC Take 0.1 mg by mouth at bedtime. (Patient not taking: Reported on 07/26/2015) 30 each 0  . ibuprofen (ADVIL,MOTRIN) 800 MG tablet Take 1 tablet (800 mg total) by mouth 3 (three) times daily. (Patient not taking: Reported on 07/27/2016) 21 tablet 0   No current facility-administered medications on file prior to visit.    No Known Allergies Social History   Social History  . Marital status: Single    Spouse name: N/A  . Number of children: N/A  . Years of education: N/A   Occupational History  . Not on file.   Social History Main Topics  . Smoking status: Never Smoker  . Smokeless tobacco: Never Used  . Alcohol use No  . Drug use: No  . Sexual activity: No   Other Topics Concern  . Not on file   Social History Narrative  . No narrative on file   Family History  Problem Relation Age of Onset  . Hypertension Mother   . Diabetes Mother   . Hyperlipidemia Mother   . Obesity Mother   . Hypertension Sister   . Hypertension Brother   . Diabetes Sister   . Obesity Sister       Review of Systems  All other systems reviewed and are negative.        Objective:   Physical Exam  Constitutional: He is oriented to person, place, and time. He appears well-developed and well-nourished. No distress.  HENT:  Right Ear: External ear normal.  Left Ear: External ear normal.  Nose: Nose normal.  Mouth/Throat: Oropharynx is clear and moist. No oropharyngeal exudate.  Eyes: Conjunctivae and EOM are normal. Pupils are equal, round, and reactive to light. Right eye exhibits no discharge. Left eye exhibits no discharge. No scleral icterus.  Neck: Normal range of motion. Neck supple. No JVD present. No thyromegaly present.  Cardiovascular: Normal rate, regular rhythm, normal heart sounds and intact distal pulses.  Exam reveals no gallop and no friction rub.   No murmur heard. Pulmonary/Chest: Effort  normal and breath sounds normal. No stridor. No respiratory distress. He has no wheezes. He has no rales. He exhibits no tenderness.  Abdominal: Soft. Bowel sounds are normal. He exhibits no distension and no mass. There is no tenderness. There is no rebound and no guarding.  Musculoskeletal: Normal range of motion. He exhibits no edema or tenderness.  Lymphadenopathy:    He has no cervical adenopathy.  Neurological: He is alert and oriented to person, place, and time. He has normal reflexes. No cranial nerve deficit. He exhibits normal muscle tone. Coordination normal.  Skin: Skin is warm. No rash noted. He is not diaphoretic. No erythema. No pallor.  Psychiatric: His affect is labile. He is slowed. Cognition and memory are impaired. He expresses impulsivity and inappropriate judgment. He is noncommunicative.  Vitals reviewed.         Assessment & Plan:  Tourette disease - Plan: fluvoxaMINE (LUVOX) 50 MG tablet  Continue clonidine 0.2 mg by mouth twice a day. Add Luvox 50 mg a day and recheck in 2 months. Consider Risperdal if worsening. I would like to avoid risk at all because of the risk of obesity worsening as well as hyperglycemia and  dyslipidemia for this patient.

## 2016-08-08 ENCOUNTER — Other Ambulatory Visit: Payer: Self-pay | Admitting: Family Medicine

## 2016-11-02 ENCOUNTER — Encounter: Payer: Self-pay | Admitting: Family Medicine

## 2016-11-02 ENCOUNTER — Ambulatory Visit (INDEPENDENT_AMBULATORY_CARE_PROVIDER_SITE_OTHER): Payer: Medicaid Other | Admitting: Family Medicine

## 2016-11-02 VITALS — BP 130/76 | HR 98 | Temp 98.7°F | Resp 20 | Ht 75.0 in | Wt 366.0 lb

## 2016-11-02 DIAGNOSIS — F840 Autistic disorder: Secondary | ICD-10-CM | POA: Diagnosis not present

## 2016-11-02 DIAGNOSIS — Z0000 Encounter for general adult medical examination without abnormal findings: Secondary | ICD-10-CM | POA: Diagnosis not present

## 2016-11-02 DIAGNOSIS — Z6841 Body Mass Index (BMI) 40.0 and over, adult: Secondary | ICD-10-CM | POA: Diagnosis not present

## 2016-11-02 DIAGNOSIS — F952 Tourette's disorder: Secondary | ICD-10-CM

## 2016-11-02 MED ORDER — RISPERIDONE 0.5 MG PO TABS: 0.5000 mg | ORAL_TABLET | Freq: Two times a day (BID) | ORAL | 0 refills | Status: DC

## 2016-11-02 NOTE — Progress Notes (Signed)
Subjective:    Patient ID: CECILIO Burton, male    DOB: 20-Nov-1990, 26 y.o.   MRN: [id number]  HPI 07/27/16 Mom states the motor tics seem to be worsening. He is having more wild gestures with his arms. He's having more focal outburst. She is interested in other options to try to help manage this other than the clonidine. Mom denies any violent behavior. Obesity is a complicating issue for this patient therefore will try to avoid Risperdal up to this point. Mom does report some compulsive behavior and repetitive behavior associated with his autism. This leaves me to believe that Luvox me may be an option.  At that time, my plan was: Continue clonidine 0.2 mg by mouth twice a day. Add Luvox 50 mg a day and recheck in 2 months. Consider Risperdal if worsening. I would like to avoid risk at all because of the risk of obesity worsening as well as hyperglycemia and dyslipidemia for this patient.  11/02/16  Wt Readings from Last 3 Encounters:  11/02/16 (!) 366 lb (166 kg)  07/27/16 (!) 372 lb (168.7 kg)  07/26/15 (!) 361 lb (163.7 kg)   Patient is here today with his mom for complete physical exam. She saw no benefit with the addition of luvox.  His motor tics tend to be worse in the morning. Throughout our entire encounter today he is grunting and yelling frequently. He is also wildly swinging his arms at times. Mom also states that occasionally when he gets upset he will try to hit her. She never is inferior. There is no intention to hurt her. The patient does not understand. Furthermore given his size, this poses a serious risk to injuring someone despite no ill intentions. He continues to be morbidly obese with a BMI greater than 45. Mom has stopped giving him Kool-Aid. However he continues to eat lots of junk food. For instance, at lunch, he has two corn dogs, potato chips, juice, and some type of cookie.  Other meals seem similar. Mom is trying to take him to the gym. He is also swelling in the  pool. I was the exercise is difficult for him given his level of understanding.     Past Medical History:  Diagnosis Date  . Autism   . Obesity   . Tourette disorder    Current Outpatient Prescriptions on File Prior to Visit  Medication Sig Dispense Refill  . cloNIDine (CATAPRES) 0.2 MG tablet TAKE ONE TABLET BY MOUTH TWICE DAILY 60 tablet 5  . fluticasone (FLONASE) 50 MCG/ACT nasal spray USE TWO SPRAY(S) IN EACH NOSTRIL ONCE DAILY 16 g 11  . fluvoxaMINE (LUVOX) 50 MG tablet Take 1 tablet (50 mg total) by mouth at bedtime. 30 tablet 5   No current facility-administered medications on file prior to visit.    No Known Allergies Social History   Social History  . Marital status: Single    Spouse name: N/A  . Number of children: N/A  . Years of education: N/A   Occupational History  . Not on file.   Social History Main Topics  . Smoking status: Never Smoker  . Smokeless tobacco: Never Used  . Alcohol use No  . Drug use: No  . Sexual activity: No   Other Topics Concern  . Not on file   Social History Narrative  . No narrative on file   Family History  Problem Relation Age of Onset  . Hypertension Mother   . Diabetes Mother   .  Hyperlipidemia Mother   . Obesity Mother   . Hypertension Sister   . Hypertension Brother   . Diabetes Sister   . Obesity Sister       Review of Systems  All other systems reviewed and are negative.      Objective:   Physical Exam  Constitutional: He is oriented to person, place, and time. He appears well-developed and well-nourished. No distress.  HENT:  Right Ear: External ear normal.  Left Ear: External ear normal.  Nose: Nose normal.  Mouth/Throat: Oropharynx is clear and moist. No oropharyngeal exudate.  Eyes: Pupils are equal, round, and reactive to light. Conjunctivae and EOM are normal. Right eye exhibits no discharge. Left eye exhibits no discharge. No scleral icterus.  Neck: Normal range of motion. Neck supple. No JVD  present. No thyromegaly present.  Cardiovascular: Normal rate, regular rhythm, normal heart sounds and intact distal pulses.  Exam reveals no gallop and no friction rub.   No murmur heard. Pulmonary/Chest: Effort normal and breath sounds normal. No stridor. No respiratory distress. He has no wheezes. He has no rales. He exhibits no tenderness.  Abdominal: Soft. Bowel sounds are normal. He exhibits no distension and no mass. There is no tenderness. There is no rebound and no guarding.  Musculoskeletal: Normal range of motion. He exhibits no edema or tenderness.  Lymphadenopathy:    He has no cervical adenopathy.  Neurological: He is alert and oriented to person, place, and time. He has normal reflexes. No cranial nerve deficit. He exhibits normal muscle tone. Coordination normal.  Skin: Skin is warm. No rash noted. He is not diaphoretic. No erythema. No pallor.  Psychiatric: His affect is labile. He is slowed. Cognition and memory are impaired. He expresses impulsivity and inappropriate judgment. He is noncommunicative.  Vitals reviewed.         Assessment & Plan:  Tourette disease  Autism disorder  General medical exam  Class 3 severe obesity due to excess calories without serious comorbidity with body mass index (BMI) of 45.0 to 49.9 in adult [facility](HCC)  Discontinued luvox as it was not beneficial. We'll start Risperdal 0.5 mg by mouth daily at bedtime and increase to 0.5 mg by mouth twice a day in 2 weeks if necessary to help control the motor tics, the vocal tics, and some of the unintentional physical violence.  Obviously this increases his potential for weight gain. Therefore I spent the remainder of his physical discussing a healthy well-balanced diet with his mother. He needs to try to limit his calories to less than 1500 per day. I suspect these anymore than 3000 cal a day. All sugary drinks and sodas and juices should be discontinued. She needs to cut his portion sizes by 30% at least  and try to incorporate more fresh fruit and vegetables.  We are unable to draw labs without sedating the patient.  Therefore mom declines this at the present time. However if we stay on Risperdal, we will need to check his lipids and sugar at least once a year.  Reassess in 1 month.

## 2017-04-16 ENCOUNTER — Other Ambulatory Visit: Payer: Self-pay | Admitting: Family Medicine

## 2017-06-09 ENCOUNTER — Telehealth: Payer: Self-pay | Admitting: Family Medicine

## 2017-06-09 NOTE — Telephone Encounter (Signed)
PATIENT Erik Burton CALLING TO INQURE ABOUT GETTING A PRESCRIPTION SENT OVER TO [facility] FOR A HOSPITAL BED PLEASE CALL HER AT   [phone]

## 2017-06-10 NOTE — Telephone Encounter (Signed)
He does not qualify for a hospital bed.  He has no physical limitation to justify that to my knowledge.

## 2017-06-10 NOTE — Telephone Encounter (Signed)
Do you know why he would require a Hospital bed?

## 2017-06-10 NOTE — Telephone Encounter (Signed)
Call placed to patient and patient made aware.  

## 2017-11-04 ENCOUNTER — Ambulatory Visit (INDEPENDENT_AMBULATORY_CARE_PROVIDER_SITE_OTHER): Payer: Medicaid Other | Admitting: Family Medicine

## 2017-11-04 ENCOUNTER — Encounter: Payer: Self-pay | Admitting: Family Medicine

## 2017-11-04 VITALS — BP 120/74 | HR 94 | Temp 98.0°F | Resp 18 | Ht 75.0 in | Wt 313.0 lb

## 2017-11-04 DIAGNOSIS — R509 Fever, unspecified: Secondary | ICD-10-CM

## 2017-11-04 LAB — GLUCOSE 16585: Glucose: 144 mg/dL — ABNORMAL HIGH (ref 65–99)

## 2017-11-04 MED ORDER — FLUTICASONE PROPIONATE 50 MCG/ACT NA SUSP: 2.0000 | Freq: Every day | NASAL | 6 refills | Status: DC

## 2017-11-04 MED ORDER — AMOXICILLIN-POT CLAVULANATE 875-125 MG PO TABS: 1.0000 | ORAL_TABLET | Freq: Two times a day (BID) | ORAL | 0 refills | Status: DC

## 2017-11-04 NOTE — Progress Notes (Signed)
Subjective:    Patient ID: Erik Burton, male    DOB: 06/09/1990, 27 y.o.   MRN: [id number]  HPI Patient is here today with his mother.  He is noncommunicative.  I am unable to obtain any history from the patient.  Mom is not able to provide specific details however she states that the patient has been running a fever all week.  The fever is subjective.  She has not actually checked his temperature however he has felt hot.  He is also been acting more tired all week long.  He has had head congestion and rhinorrhea.  Mom states he acts like he has a headache.  She denies having the patient pull at his ears.  She denies any trouble swallowing however his appetite has been poor.  She denies any nausea or vomiting.  She denies any cough or chest congestion.  She denies any rash.  He is lost substantial weight since I last saw him: Wt Readings from Last 3 Encounters:  11/04/17 (!) 313 lb (142 kg)  11/02/16 (!) 366 lb (166 kg)  07/27/16 (!) 372 lb (168.7 kg)   Mom attributes this to exercise.  Patient is extremely large and will not allow blood work.  He becomes combative with any venous stick.  He requires conscious sedation for lab work.  However I obtained a random blood sugar today.  The patient ate approximately 2 hours ago.  His random blood sugar is 144 on a fingerstick.  It was extremely difficult even to obtain this.  I had to put the patient in an arm bar while his mother helped hold him down for the lab technician to perform the fingerstick.  However the blood sugar is reassuring. Past Medical History:  Diagnosis Date  . Autism   . Obesity   . Tourette disorder    Current Outpatient Medications on File Prior to Visit  Medication Sig Dispense Refill  . cloNIDine (CATAPRES) 0.2 MG tablet TAKE 1 TABLET BY MOUTH TWICE DAILY 60 tablet 5  . fluticasone (FLONASE) 50 MCG/ACT nasal spray USE TWO SPRAY(S) IN EACH NOSTRIL ONCE DAILY 16 g 11  . haloperidol (HALDOL) 2 MG/ML solution TAKE 2 MG  BY MOUTH IN THE MORNING  2   No current facility-administered medications on file prior to visit.    No Known Allergies Social History   Socioeconomic History  . Marital status: Single    Spouse name: Not on file  . Number of children: Not on file  . Years of education: Not on file  . Highest education level: Not on file  Occupational History  . Not on file  Social Needs  . Financial resource strain: Not on file  . Food insecurity:    Worry: Not on file    Inability: Not on file  . Transportation needs:    Medical: Not on file    Non-medical: Not on file  Tobacco Use  . Smoking status: Never Smoker  . Smokeless tobacco: Never Used  Substance and Sexual Activity  . Alcohol use: No  . Drug use: No  . Sexual activity: Never  Lifestyle  . Physical activity:    Days per week: Not on file    Minutes per session: Not on file  . Stress: Not on file  Relationships  . Social connections:    Talks on phone: Not on file    Gets together: Not on file    Attends religious service: Not on file  Active member of club or organization: Not on file    Attends meetings of clubs or organizations: Not on file    Relationship status: Not on file  . Intimate partner violence:    Fear of current or ex partner: Not on file    Emotionally abused: Not on file    Physically abused: Not on file    Forced sexual activity: Not on file  Other Topics Concern  . Not on file  Social History Narrative  . Not on file   Family History  Problem Relation Age of Onset  . Hypertension Mother   . Diabetes Mother   . Hyperlipidemia Mother   . Obesity Mother   . Hypertension Sister   . Hypertension Brother   . Diabetes Sister   . Obesity Sister       Review of Systems  All other systems reviewed and are negative.      Objective:   Physical Exam  Constitutional: He is oriented to person, place, and time. He appears well-developed and well-nourished. No distress.  HENT:  Right Ear:  External ear normal.  Left Ear: External ear normal.  Nose: Nose normal.  Mouth/Throat: Oropharynx is clear and moist. No oropharyngeal exudate.  Eyes: Pupils are equal, round, and reactive to light. Conjunctivae and EOM are normal. Right eye exhibits no discharge. Left eye exhibits no discharge. No scleral icterus.  Neck: Normal range of motion. Neck supple. No JVD present. No thyromegaly present.  Cardiovascular: Normal rate, regular rhythm, normal heart sounds and intact distal pulses. Exam reveals no gallop and no friction rub.  No murmur heard. Pulmonary/Chest: Effort normal and breath sounds normal. No stridor. No respiratory distress. He has no wheezes. He has no rales. He exhibits no tenderness.  Abdominal: Soft. Bowel sounds are normal. He exhibits no distension and no mass. There is no tenderness. There is no rebound and no guarding.  Musculoskeletal: Normal range of motion. He exhibits no edema or tenderness.  Lymphadenopathy:    He has no cervical adenopathy.  Neurological: He is alert and oriented to person, place, and time. He has normal reflexes. No cranial nerve deficit. He exhibits normal muscle tone. Coordination normal.  Skin: Skin is warm. No rash noted. He is not diaphoretic. No erythema. No pallor.  Psychiatric: His affect is labile. He is slowed. Cognition and memory are impaired. He expresses impulsivity and inappropriate judgment. He is noncommunicative.  Vitals reviewed.  Patient has bilateral cerumen impactions however I am unable to perform an irrigation or any type of procedure to remove the earwax with the patient conscious as he physically will not allow it.       Assessment & Plan:  Fever, unspecified fever cause - Plan: Glucose, fingerstick (stat), CANCELED: Hemoglobin, fingerstick  Given the headache, the head congestion, and the fever, I suspect patient may have sinusitis.  Therefore we will treat the patient with amoxicillin 875 mg p.o. twice daily for 10  days.  Recheck on Monday or seek medical attention immediately if worsening.  Random blood sugar was reassuring and did not suggest hyperosmolarity as a cause of the patient's weight loss.  Patient will require sedation in order to obtain fasting lab work.  Mother defers that at the present time

## 2017-11-09 ENCOUNTER — Ambulatory Visit (INDEPENDENT_AMBULATORY_CARE_PROVIDER_SITE_OTHER): Payer: Medicaid Other | Admitting: Family Medicine

## 2017-11-09 ENCOUNTER — Encounter: Payer: Self-pay | Admitting: Family Medicine

## 2017-11-09 VITALS — BP 120/80 | HR 88 | Temp 98.2°F | Resp 18 | Ht 75.0 in | Wt 317.0 lb

## 2017-11-09 DIAGNOSIS — E6609 Other obesity due to excess calories: Secondary | ICD-10-CM

## 2017-11-09 DIAGNOSIS — F840 Autistic disorder: Secondary | ICD-10-CM | POA: Diagnosis not present

## 2017-11-09 DIAGNOSIS — F952 Tourette's disorder: Secondary | ICD-10-CM | POA: Diagnosis not present

## 2017-11-09 DIAGNOSIS — Z6839 Body mass index (BMI) 39.0-39.9, adult: Secondary | ICD-10-CM

## 2017-11-09 DIAGNOSIS — Z0000 Encounter for general adult medical examination without abnormal findings: Secondary | ICD-10-CM | POA: Diagnosis not present

## 2017-11-09 DIAGNOSIS — R634 Abnormal weight loss: Secondary | ICD-10-CM

## 2017-11-09 MED ORDER — DIAZEPAM 10 MG PO TABS
10.0000 mg | ORAL_TABLET | Freq: Two times a day (BID) | ORAL | 0 refills | Status: DC | PRN
Start: 2017-11-09 — End: 2020-11-25

## 2017-11-09 NOTE — Progress Notes (Signed)
Subjective:    Patient ID: Erik Burton, male    DOB: 1990/06/30, 27 y.o.   MRN: [id number]  HPI Patient is here today with his mom for complete physical exam.  Since I saw the patient last year, he is going to a psychiatrist who is prescribing Haldol to help manage his Tourette's and autism.  Since I saw him, he has lost roughly 50 pounds.  It is last visit, we checked a random blood sugar that was nonfasting that was within normal range.  However I am still concerned by the rapid weight loss.  Perhaps this is due to the change from Risperdal to Haldol however I have recommended lab work to evaluate for underlying abnormalities that could explain the weight loss.  Mom agrees and would like to bring the patient back for lab work.  The question is how best to accomplish this.  He is extremely strong and will not consent to lab work due to lack of understanding and insight.  Therefore we have recommended trying to premedicate with Valium to relax him to see if we can draw lab work here.  Mom agrees and would like to try next Friday.  Patient is due for a flu shot.  He is due for a tetanus shot.  He is due for HIV screening.  Mom declines a tetanus shot HIV screening today.  She will consider a flu shot this fall.  He is doing much better since when I saw him last week.  His fevers went away 24 hours on antibiotics.  Today he is much more alert responsive and is smiling on exam in no apparent distress  Past Medical History:  Diagnosis Date  . Autism   . Obesity   . Tourette disorder    Current Outpatient Medications on File Prior to Visit  Medication Sig Dispense Refill  . amoxicillin-clavulanate (AUGMENTIN) 875-125 MG tablet Take 1 tablet by mouth 2 (two) times daily. 20 tablet 0  . cloNIDine (CATAPRES) 0.2 MG tablet TAKE 1 TABLET BY MOUTH TWICE DAILY 60 tablet 5  . fluticasone (FLONASE) 50 MCG/ACT nasal spray USE TWO SPRAY(S) IN EACH NOSTRIL ONCE DAILY 16 g 11  . fluticasone (FLONASE) 50  MCG/ACT nasal spray Place 2 sprays into both nostrils daily. 16 g 6  . haloperidol (HALDOL) 2 MG/ML solution TAKE 2 MG BY MOUTH IN THE MORNING  2   No current facility-administered medications on file prior to visit.    No Known Allergies Social History   Socioeconomic History  . Marital status: Single    Spouse name: Not on file  . Number of children: Not on file  . Years of education: Not on file  . Highest education level: Not on file  Occupational History  . Not on file  Social Needs  . Financial resource strain: Not on file  . Food insecurity:    Worry: Not on file    Inability: Not on file  . Transportation needs:    Medical: Not on file    Non-medical: Not on file  Tobacco Use  . Smoking status: Never Smoker  . Smokeless tobacco: Never Used  Substance and Sexual Activity  . Alcohol use: No  . Drug use: No  . Sexual activity: Never  Lifestyle  . Physical activity:    Days per week: Not on file    Minutes per session: Not on file  . Stress: Not on file  Relationships  . Social connections:    Talks  on phone: Not on file    Gets together: Not on file    Attends religious service: Not on file    Active member of club or organization: Not on file    Attends meetings of clubs or organizations: Not on file    Relationship status: Not on file  . Intimate partner violence:    Fear of current or ex partner: Not on file    Emotionally abused: Not on file    Physically abused: Not on file    Forced sexual activity: Not on file  Other Topics Concern  . Not on file  Social History Narrative  . Not on file   Family History  Problem Relation Age of Onset  . Hypertension Mother   . Diabetes Mother   . Hyperlipidemia Mother   . Obesity Mother   . Hypertension Sister   . Hypertension Brother   . Diabetes Sister   . Obesity Sister       Review of Systems  All other systems reviewed and are negative.      Objective:   Physical Exam  Constitutional: He is  oriented to person, place, and time. He appears well-developed and well-nourished. No distress.  HENT:  Right Ear: External ear normal.  Left Ear: External ear normal.  Nose: Nose normal.  Mouth/Throat: Oropharynx is clear and moist. No oropharyngeal exudate.  Eyes: Pupils are equal, round, and reactive to light. Conjunctivae and EOM are normal. Right eye exhibits no discharge. Left eye exhibits no discharge. No scleral icterus.  Neck: Normal range of motion. Neck supple. No JVD present. No thyromegaly present.  Cardiovascular: Normal rate, regular rhythm, normal heart sounds and intact distal pulses. Exam reveals no gallop and no friction rub.  No murmur heard. Pulmonary/Chest: Effort normal and breath sounds normal. No stridor. No respiratory distress. He has no wheezes. He has no rales. He exhibits no tenderness.  Abdominal: Soft. Bowel sounds are normal. He exhibits no distension and no mass. There is no tenderness. There is no rebound and no guarding.  Musculoskeletal: Normal range of motion. He exhibits no edema or tenderness.  Lymphadenopathy:    He has no cervical adenopathy.  Neurological: He is alert and oriented to person, place, and time. He has normal reflexes. No cranial nerve deficit. He exhibits normal muscle tone. Coordination normal.  Skin: Skin is warm. No rash noted. He is not diaphoretic. No erythema. No pallor.  Psychiatric: His affect is labile. He is slowed. Cognition and memory are impaired. He expresses impulsivity and inappropriate judgment. He is noncommunicative.  Vitals reviewed.         Assessment & Plan:  General medical exam - Plan: CBC with Differential/Platelet, COMPLETE METABOLIC PANEL WITH GFR, Lipid panel, Hemoglobin A1c, TSH  Class 2 obesity due to excess calories without serious comorbidity with body mass index (BMI) of 39.0 to 39.9 in adult - Plan: CBC with Differential/Platelet, COMPLETE METABOLIC PANEL WITH GFR, Lipid panel, Hemoglobin A1c,  TSH  Autism  Tourette disorder  Weight loss - Plan: CBC with Differential/Platelet, COMPLETE METABOLIC PANEL WITH GFR, Lipid panel, Hemoglobin A1c, TSH   I will have the patient return next Friday.  He will take Valium 10 mg 30 minutes prior.  We will attempt to order a CBC, CMP, fasting lipid panel, hemoglobin A1c, and TSH.  If we are unable to get labs, I will try to get a fingerstick hemoglobin A1c.  I recommended a flu shot this fall.  Patient's mother declined tetanus  and HIV screening.  I am happy that he is lost weight assuming that his lab work is normal.  He is a much healthier weight for his height.  Continue to work on diet and avoiding fried food junk food sodas etc.

## 2017-11-19 ENCOUNTER — Other Ambulatory Visit: Payer: Medicaid Other

## 2017-11-26 ENCOUNTER — Other Ambulatory Visit: Payer: Medicaid Other

## 2017-11-26 DIAGNOSIS — Z6839 Body mass index (BMI) 39.0-39.9, adult: Secondary | ICD-10-CM

## 2017-11-26 DIAGNOSIS — Z0000 Encounter for general adult medical examination without abnormal findings: Secondary | ICD-10-CM

## 2017-11-26 DIAGNOSIS — R634 Abnormal weight loss: Secondary | ICD-10-CM

## 2017-11-26 DIAGNOSIS — E6609 Other obesity due to excess calories: Secondary | ICD-10-CM

## 2017-11-26 LAB — HEMOGLOBIN A1C, FINGERSTICK: Hgb A1C (fingerstick): 5.4 % OF TOTAL HGB (ref <6.0)

## 2018-03-09 ENCOUNTER — Other Ambulatory Visit: Payer: Self-pay | Admitting: Family Medicine

## 2018-11-14 ENCOUNTER — Ambulatory Visit (INDEPENDENT_AMBULATORY_CARE_PROVIDER_SITE_OTHER): Payer: Medicaid Other | Admitting: Family Medicine

## 2018-11-14 ENCOUNTER — Other Ambulatory Visit: Payer: Self-pay

## 2018-11-14 ENCOUNTER — Encounter: Payer: Self-pay | Admitting: Family Medicine

## 2018-11-14 VITALS — BP 144/86 | HR 94 | Temp 98.6°F | Resp 18 | Ht 75.0 in | Wt 367.0 lb

## 2018-11-14 DIAGNOSIS — F840 Autistic disorder: Secondary | ICD-10-CM

## 2018-11-14 DIAGNOSIS — F952 Tourette's disorder: Secondary | ICD-10-CM | POA: Diagnosis not present

## 2018-11-14 DIAGNOSIS — Z0000 Encounter for general adult medical examination without abnormal findings: Secondary | ICD-10-CM

## 2018-11-14 DIAGNOSIS — Z0001 Encounter for general adult medical examination with abnormal findings: Secondary | ICD-10-CM

## 2018-11-14 MED ORDER — MELOXICAM 15 MG PO TABS: 15.0000 mg | ORAL_TABLET | Freq: Every day | ORAL | 0 refills | Status: DC

## 2018-11-14 NOTE — Progress Notes (Signed)
Subjective:    Patient ID: Erik Burton, male    DOB: 1990/07/28, 28 y.o.   MRN: [id number]  HPI Patient is here today with his mom for complete physical exam.  Patient is no longer taking Haldol for his Tourette's disorder.  He has been switched to [place].  Mom states that this seems to be working better.  Previously on Haldol he was demonstrating impulse control.  He was breaking sinks, standing on furniture.  He was difficult to control.  This is improved since switching to the new medication.  Throughout today's encounter he is constantly talking.  He will spontaneously blurred sounds that are not words.  He also has motor tics particularly in his arms.  He is gained 50 pounds since I last saw him.  Mom states that they are now at home quarantined.  They are eating poorly.  She admits to feeding him soda.  They are also eating a lot of carbohydrates.  For instance most meals involve to hot pockets and a bowl of cereal plus soda.  Neither she nor he is exercising.  She does state that when he does walk he appears to have some pain in his left ankle.  With flexion and extension there is some mild apparent discomfort over the anterior ankle mortise.  There is no swelling.  There is no erythema.  There is no crepitus.  Past Medical History:  Diagnosis Date  . Autism   . Obesity   . Tourette disorder    Current Outpatient Medications on File Prior to Visit  Medication Sig Dispense Refill  . cloNIDine (CATAPRES) 0.2 MG tablet TAKE 1 TABLET BY MOUTH TWICE DAILY 60 tablet 5  . diazepam (VALIUM) 10 MG tablet Take 1 tablet (10 mg total) by mouth every 12 (twelve) hours as needed for anxiety. Take 1 hour prior to labwork. 1 tablet 0  . FANAPT 4 MG TABS tablet TAKE 1 2 (ONE HALF) TABLET BY MOUTH AT BEDTIME FOR 2 DAYS THEN TAKE 1 TAB AT BEDTIME FOR 7 DAYS THEN TAKE 1 TAB TWICE DAILY    . fluticasone (FLONASE) 50 MCG/ACT nasal spray USE TWO SPRAY(S) IN EACH NOSTRIL ONCE DAILY 16 g 11   No current  facility-administered medications on file prior to visit.    No Known Allergies Social History   Socioeconomic History  . Marital status: Single    Spouse name: Not on file  . Number of children: Not on file  . Years of education: Not on file  . Highest education level: Not on file  Occupational History  . Not on file  Social Needs  . Financial resource strain: Not on file  . Food insecurity    Worry: Not on file    Inability: Not on file  . Transportation needs    Medical: Not on file    Non-medical: Not on file  Tobacco Use  . Smoking status: Never Smoker  . Smokeless tobacco: Never Used  Substance and Sexual Activity  . Alcohol use: No  . Drug use: No  . Sexual activity: Never  Lifestyle  . Physical activity    Days per week: Not on file    Minutes per session: Not on file  . Stress: Not on file  Relationships  . Social Herbalist on phone: Not on file    Gets together: Not on file    Attends religious service: Not on file    Active member of club or  organization: Not on file    Attends meetings of clubs or organizations: Not on file    Relationship status: Not on file  . Intimate partner violence    Fear of current or ex partner: Not on file    Emotionally abused: Not on file    Physically abused: Not on file    Forced sexual activity: Not on file  Other Topics Concern  . Not on file  Social History Narrative  . Not on file   Family History  Problem Relation Age of Onset  . Hypertension Mother   . Diabetes Mother   . Hyperlipidemia Mother   . Obesity Mother   . Hypertension Sister   . Hypertension Brother   . Diabetes Sister   . Obesity Sister       Review of Systems  All other systems reviewed and are negative.      Objective:   Physical Exam  Constitutional: He is oriented to person, place, and time. He appears well-developed and well-nourished. No distress.  HENT:  Right Ear: External ear normal.  Left Ear: External ear  normal.  Nose: Nose normal.  Mouth/Throat: Oropharynx is clear and moist. No oropharyngeal exudate.  Eyes: Pupils are equal, round, and reactive to light. Conjunctivae and EOM are normal. Right eye exhibits no discharge. Left eye exhibits no discharge. No scleral icterus.  Neck: Normal range of motion. Neck supple. No JVD present. No thyromegaly present.  Cardiovascular: Normal rate, regular rhythm, normal heart sounds and intact distal pulses. Exam reveals no gallop and no friction rub.  No murmur heard. Pulmonary/Chest: Effort normal and breath sounds normal. No stridor. No respiratory distress. He has no wheezes. He has no rales. He exhibits no tenderness.  Abdominal: Soft. Bowel sounds are normal. He exhibits no distension and no mass. There is no abdominal tenderness. There is no rebound and no guarding.  Musculoskeletal: Normal range of motion.        General: No tenderness or edema.  Lymphadenopathy:    He has no cervical adenopathy.  Neurological: He is alert and oriented to person, place, and time. He has normal reflexes. No cranial nerve deficit. He exhibits normal muscle tone. Coordination normal.  Skin: Skin is warm. No rash noted. He is not diaphoretic. No erythema. No pallor.  Psychiatric: His affect is labile. He is slowed. Cognition and memory are impaired. He expresses impulsivity and inappropriate judgment. He is noncommunicative.  Vitals reviewed.         Assessment & Plan:  1. General medical exam Patient has refused lab work in the past.  Last year, it was a struggle to get an A1c to check for diabetes with the weight loss.  I have recommended mom change his diet and remove the soda given her history of diabetes he is at high risk for type 2 diabetes.  I have also recommended the flu shot this fall.  I recommended 30 minutes a day of aerobic exercise 5 days a week.  2. Tourette disorder Per psychiatry  3. Autism Per psychiatry  4. Class 3 severe obesity due to  excess calories in adult, unspecified BMI, unspecified whether serious comorbidity present (HCC) Recommended 30 minutes a day 5 days a week of aerobic exercise.  Recommended discontinuing soda.  Recommended reducing his consumption of junk food including hot pockets and soda.  Recommended more fruits and vegetables.  Blood pressure today is elevated.  Recommend that mom check his blood pressure at home and notify us of  the values over the next 1 to 2 weeks.  If consistently greater than 140/90 he may require medication

## 2018-12-27 ENCOUNTER — Ambulatory Visit: Payer: Medicaid Other | Admitting: Family Medicine

## 2018-12-29 ENCOUNTER — Other Ambulatory Visit: Payer: Self-pay

## 2018-12-29 ENCOUNTER — Ambulatory Visit (INDEPENDENT_AMBULATORY_CARE_PROVIDER_SITE_OTHER): Payer: Medicaid Other | Admitting: Family Medicine

## 2018-12-29 ENCOUNTER — Encounter: Payer: Self-pay | Admitting: Family Medicine

## 2018-12-29 VITALS — BP 126/82 | HR 94 | Temp 98.5°F | Resp 20 | Ht 75.0 in | Wt 367.0 lb

## 2018-12-29 DIAGNOSIS — I10 Essential (primary) hypertension: Secondary | ICD-10-CM

## 2018-12-29 MED ORDER — ATENOLOL 50 MG PO TABS: 50.0000 mg | ORAL_TABLET | Freq: Every day | ORAL | 3 refills | Status: DC

## 2018-12-29 NOTE — Progress Notes (Signed)
Subjective:    Patient ID: Erik Burton, male    DOB: [birth year], 28 y.o.   MRN: [id number]  HPI  7/20 Patient is here today with his mom for complete physical exam.  Patient is no longer taking Haldol for his Tourette's disorder.  He has been switched to [place].  Mom states that this seems to be working better.  Previously on Haldol he was demonstrating impulse control.  He was breaking sinks, standing on furniture.  He was difficult to control.  This is improved since switching to the new medication.  Throughout today's encounter he is constantly talking.  He will spontaneously blurred sounds that are not words.  He also has motor tics particularly in his arms.  He is gained 50 pounds since I last saw him.  Mom states that they are now at home quarantined.  They are eating poorly.  She admits to feeding him soda.  They are also eating a lot of carbohydrates.  For instance most meals involve to hot pockets and a bowl of cereal plus soda.  Neither she nor he is exercising.  She does state that when he does walk he appears to have some pain in his left ankle.  With flexion and extension there is some mild apparent discomfort over the anterior ankle mortise.  There is no swelling.  There is no erythema.  There is no crepitus.  At that time, my plan was: 1. General medical exam Patient has refused lab work in the past.  Last year, it was a struggle to get an A1c to check for diabetes with the weight loss.  I have recommended mom change his diet and remove the soda given her history of diabetes he is at high risk for type 2 diabetes.  I have also recommended the flu shot this fall.  I recommended 30 minutes a day of aerobic exercise 5 days a week.  2. Tourette disorder Per psychiatry  3. Autism Per psychiatry  4. Class 3 severe obesity due to excess calories in adult, unspecified BMI, unspecified whether serious comorbidity present (HCC) Recommended 30 minutes a day 5 days a week of aerobic  exercise.  Recommended discontinuing soda.  Recommended reducing his consumption of junk food including hot pockets and soda.  Recommended more fruits and vegetables.  Blood pressure today is elevated.  Recommend that mom check his blood pressure at home and notify us of the values over the next 1 to 2 weeks.  If consistently greater than 140/90 he may require medication  12/29/18 Patient's mom has been checking his blood pressure at home.  She is getting variable numbers.  Systolic blood pressure has been averaging between 145 and 167/95-97.  Here today my nurse checked his blood pressure and found it to be different however the patient's blood pressure is very difficult to check.  He is constantly in motion due to his Tourette's.  This causes a tremendous amount of background noise that may complicate the measurement of his blood pressure.  I personally measured his blood pressure.  I had a difficult time hearing his systolic blood pressure due to background noise.  However I got his systolic blood pressure near 142.  His diastolic blood pressure however was 98 and in keeping with a diastolic blood pressures that his mother has been recording.  Patient due to his Tourette's syndrome and autism and size would be a very difficult lab draw potentially dangerous for the lab technician as well as for the  patient.  Therefore mom would like to use a blood pressure medication but does not require laboratory monitoring.  Past Medical History:  Diagnosis Date  . Autism   . Obesity   . Tourette disorder    Current Outpatient Medications on File Prior to Visit  Medication Sig Dispense Refill  . cloNIDine (CATAPRES) 0.2 MG tablet TAKE 1 TABLET BY MOUTH TWICE DAILY 60 tablet 5  . diazepam (VALIUM) 10 MG tablet Take 1 tablet (10 mg total) by mouth every 12 (twelve) hours as needed for anxiety. Take 1 hour prior to labwork. 1 tablet 0  . FANAPT 4 MG TABS tablet TAKE 1 2 (ONE HALF) TABLET BY MOUTH AT BEDTIME FOR 2  DAYS THEN TAKE 1 TAB AT BEDTIME FOR 7 DAYS THEN TAKE 1 TAB TWICE DAILY    . fluticasone (FLONASE) 50 MCG/ACT nasal spray USE TWO SPRAY(S) IN EACH NOSTRIL ONCE DAILY 16 g 11  . meloxicam (MOBIC) 15 MG tablet Take 1 tablet (15 mg total) by mouth daily. 30 tablet 0   No current facility-administered medications on file prior to visit.    No Known Allergies Social History   Socioeconomic History  . Marital status: Single    Spouse name: Not on file  . Number of children: Not on file  . Years of education: Not on file  . Highest education level: Not on file  Occupational History  . Not on file  Social Needs  . Financial resource strain: Not on file  . Food insecurity    Worry: Not on file    Inability: Not on file  . Transportation needs    Medical: Not on file    Non-medical: Not on file  Tobacco Use  . Smoking status: Never Smoker  . Smokeless tobacco: Never Used  Substance and Sexual Activity  . Alcohol use: No  . Drug use: No  . Sexual activity: Never  Lifestyle  . Physical activity    Days per week: Not on file    Minutes per session: Not on file  . Stress: Not on file  Relationships  . Social Musician on phone: Not on file    Gets together: Not on file    Attends religious service: Not on file    Active member of club or organization: Not on file    Attends meetings of clubs or organizations: Not on file    Relationship status: Not on file  . Intimate partner violence    Fear of current or ex partner: Not on file    Emotionally abused: Not on file    Physically abused: Not on file    Forced sexual activity: Not on file  Other Topics Concern  . Not on file  Social History Narrative  . Not on file   Family History  Problem Relation Age of Onset  . Hypertension Mother   . Diabetes Mother   . Hyperlipidemia Mother   . Obesity Mother   . Hypertension Sister   . Hypertension Brother   . Diabetes Sister   . Obesity Sister       Review of  Systems  All other systems reviewed and are negative.      Objective:   Physical Exam  Constitutional: He appears well-developed and well-nourished. No distress.  Cardiovascular: Normal rate, regular rhythm, normal heart sounds and intact distal pulses. Exam reveals no gallop and no friction rub.  No murmur heard. Pulmonary/Chest: Effort normal and breath sounds normal.  No respiratory distress. He has no wheezes. He has no rales. He exhibits no tenderness.  Abdominal: Soft. Bowel sounds are normal. He exhibits no distension and no mass. There is no abdominal tenderness. There is no rebound and no guarding.  Musculoskeletal: Normal range of motion.        General: No edema.  Skin: He is not diaphoretic.  Psychiatric: His affect is labile. He is slowed. Cognition and memory are impaired. He expresses impulsivity and inappropriate judgment. He is noncommunicative.  Vitals reviewed.         Assessment & Plan:  Benign essential HTN  Patient's heart rate today is also slightly elevated above 100.  Therefore I recommended trying atenolol 50 mg p.o. daily and rechecking blood pressure and heart rate in 2 weeks.  I chose this medication both to help manage his tachycardia and blood pressure as well as the fact the medication does not require any laboratory monitoring.  Mom is comfortable with this decision and will call me with his blood pressure in a couple weeks.

## 2019-08-31 ENCOUNTER — Ambulatory Visit: Payer: Medicaid Other | Admitting: Family Medicine

## 2019-09-04 ENCOUNTER — Other Ambulatory Visit: Payer: Self-pay

## 2019-09-04 ENCOUNTER — Encounter: Payer: Self-pay | Admitting: Family Medicine

## 2019-09-04 ENCOUNTER — Ambulatory Visit (INDEPENDENT_AMBULATORY_CARE_PROVIDER_SITE_OTHER): Payer: Medicaid Other | Admitting: Family Medicine

## 2019-09-04 VITALS — BP 110/68 | HR 78 | Temp 97.9°F | Resp 18 | Ht 75.0 in | Wt 393.0 lb

## 2019-09-04 DIAGNOSIS — F840 Autistic disorder: Secondary | ICD-10-CM

## 2019-09-04 DIAGNOSIS — M7989 Other specified soft tissue disorders: Secondary | ICD-10-CM | POA: Diagnosis not present

## 2019-09-04 DIAGNOSIS — R3589 Other polyuria: Secondary | ICD-10-CM

## 2019-09-04 DIAGNOSIS — R358 Other polyuria: Secondary | ICD-10-CM | POA: Diagnosis not present

## 2019-09-04 DIAGNOSIS — F952 Tourette's disorder: Secondary | ICD-10-CM | POA: Diagnosis not present

## 2019-09-04 LAB — URINALYSIS, ROUTINE W REFLEX MICROSCOPIC
Bilirubin Urine: NEGATIVE
Glucose, UA: NEGATIVE
Hgb urine dipstick: NEGATIVE
Ketones, ur: NEGATIVE
Leukocytes,Ua: NEGATIVE
Nitrite: NEGATIVE
Protein, ur: NEGATIVE
Specific Gravity, Urine: 1.025 (ref 1.001–1.03)
pH: 7 (ref 5.0–8.0)

## 2019-09-04 MED ORDER — SOLIFENACIN SUCCINATE 10 MG PO TABS: 10.0000 mg | ORAL_TABLET | Freq: Every day | ORAL | 2 refills | Status: DC

## 2019-09-04 MED ORDER — FLUTICASONE PROPIONATE 50 MCG/ACT NA SUSP: 2.0000 | Freq: Every day | NASAL | 2 refills | Status: DC

## 2019-09-04 NOTE — Progress Notes (Signed)
Subjective:    Patient ID: Erik Burton, male    DOB: Jul 27, 1990, 29 y.o.   MRN: [id number]  HPI  Wt Readings from Last 3 Encounters:  09/04/19 (!) 393 lb (178.3 kg)  12/29/18 (!) 367 lb (166.5 kg)  11/14/18 (!) 367 lb (166.5 kg)   Patient is a 29 year old African-American gentleman with a history of autism and Tourette's disorder and developmental delay who presents today with his mother for increased urinary frequency.  Mom has a history of type 2 diabetes.  She states that recently, the patient has increased episodes of urinary incontinence.  Yesterday he urinated on his bedroom floor.  This morning he urinated on his bed.  This is not like him.  She denies any increased frequency of urination.  Instead is more of his change in behavior.  She wonders if he may be acting out.  Recently her daughter and her 2 infants moved back in.  The babies have been crying in the household is more stressful.  She thinks that the patient may be acting out.  She denies any weight loss.  In fact he has gained 26 pounds since September of last year.  He is also not drinking more fluid.  Obviously we would like to check his blood sugar and lab work however the patient due to his developmental delay and noncommunicative nature physically is combative if you try to draw blood.  He will not even allow [place] to do fingersticks without being restrained.  The patient is almost 100 pounds and very strong and therefore physically restraining him to draw labs is potentially dangerous to my staff.  Thankfully we are able to get a urinalysis today.  There is no nitrites.  There is no leukocyte esterase.  There is also no ketones and no blood sugar in it.  The lack of hyperglycemia would make polyuria secondary to diabetes unlikely.  Past Medical History:  Diagnosis Date  . Autism   . Hypertension   . Obesity   . Tourette disorder    Current Outpatient Medications on File Prior to Visit  Medication Sig Dispense Refill   . atenolol (TENORMIN) 50 MG tablet Take 1 tablet (50 mg total) by mouth daily. 90 tablet 3  . cloNIDine (CATAPRES) 0.2 MG tablet TAKE 1 TABLET BY MOUTH TWICE DAILY 60 tablet 5  . diazepam (VALIUM) 10 MG tablet Take 1 tablet (10 mg total) by mouth every 12 (twelve) hours as needed for anxiety. Take 1 hour prior to labwork. 1 tablet 0  . FANAPT 4 MG TABS tablet TAKE 1 2 (ONE HALF) TABLET BY MOUTH AT BEDTIME FOR 2 DAYS THEN TAKE 1 TAB AT BEDTIME FOR 7 DAYS THEN TAKE 1 TAB TWICE DAILY    . fluticasone (FLONASE) 50 MCG/ACT nasal spray Place 2 sprays into both nostrils daily.     No current facility-administered medications on file prior to visit.   No Known Allergies Social History   Socioeconomic History  . Marital status: Single    Spouse name: Not on file  . Number of children: Not on file  . Years of education: Not on file  . Highest education level: Not on file  Occupational History  . Not on file  Tobacco Use  . Smoking status: Never Smoker  . Smokeless tobacco: Never Used  Substance and Sexual Activity  . Alcohol use: No  . Drug use: No  . Sexual activity: Never  Other Topics Concern  . Not on file  Social History Narrative  . Not on file   Social Determinants of Health   Financial Resource Strain:   . Difficulty of Paying Living Expenses:   Food Insecurity:   . Worried About Charity fundraiser in the Last Year:   . Arboriculturist in the Last Year:   Transportation Needs:   . Film/video editor (Medical):   Marland Kitchen Lack of Transportation (Non-Medical):   Physical Activity:   . Days of Exercise per Week:   . Minutes of Exercise per Session:   Stress:   . Feeling of Stress :   Social Connections:   . Frequency of Communication with Friends and Family:   . Frequency of Social Gatherings with Friends and Family:   . Attends Religious Services:   . Active Member of Clubs or Organizations:   . Attends Archivist Meetings:   Marland Kitchen Marital Status:   Intimate  Partner Violence:   . Fear of Current or Ex-Partner:   . Emotionally Abused:   Marland Kitchen Physically Abused:   . Sexually Abused:    Family History  Problem Relation Age of Onset  . Hypertension Mother   . Diabetes Mother   . Hyperlipidemia Mother   . Obesity Mother   . Hypertension Sister   . Hypertension Brother   . Diabetes Sister   . Obesity Sister       Review of Systems  All other systems reviewed and are negative.      Objective:   Physical Exam  Constitutional: He appears well-developed and well-nourished. No distress.  Cardiovascular: Normal rate, regular rhythm, normal heart sounds and intact distal pulses. Exam reveals no gallop and no friction rub.  No murmur heard. Pulmonary/Chest: Effort normal and breath sounds normal. No respiratory distress. He has no wheezes. He has no rales. He exhibits no tenderness.  Abdominal: Soft. Bowel sounds are normal. He exhibits no distension and no mass. There is no abdominal tenderness. There is no rebound and no guarding.  Musculoskeletal:        General: No edema. Normal range of motion.  Skin: He is not diaphoretic.  Psychiatric: His affect is labile. He is slowed. Cognition and memory are impaired. He expresses impulsivity and inappropriate judgment. He is noncommunicative.  Vitals reviewed.     On physical exam today, his right foot is not markedly swollen.  There is no pitting edema.  There is no redness or warmth.  There is no deformity or bruising    Assessment & Plan:  Autism  Tourette disorder  Polyuria  Swelling of right foot  The patient's autism and Tourette's disorder and developmental delay make lab work potentially dangerous both to the patient and my staff.  After seeing the normal urinalysis and obtaining the history, I suspect hyperglycemia as a cause of his urinary incontinence is unlikely.  I suspect this is more behavioral due to the environmental changes.  The only way we will be able to get lab work is  by "knocking him out" according to his mother.  Therefore we will attempt to manage this with Vesicare 10 mg a day and behavioral changes.  I encouraged that she take him to the restroom every 2 hours in an effort to empty his bladder on the schedule unless see if this improves the episodes of urinary incontinence.  Otherwise we will have to physically restrain and sedate the patient to obtain lab work.  Today the patient would not even allow my nurse to  check his pulse oximetry.  I believe the swelling is due to mild chronic venous insufficiency

## 2019-09-13 ENCOUNTER — Other Ambulatory Visit: Payer: Self-pay | Admitting: Family Medicine

## 2019-09-13 MED ORDER — VESICARE 10 MG PO TABS: 10.0000 mg | ORAL_TABLET | Freq: Every day | ORAL | 5 refills | Status: DC

## 2019-09-13 NOTE — Progress Notes (Signed)
Per [place] Medicaid formulary Vesicare brand name is covered and generic solifenacin was not so brand name was sent to pharmacy at some dose.

## 2019-11-10 ENCOUNTER — Telehealth: Payer: Self-pay

## 2019-11-10 NOTE — Telephone Encounter (Signed)
Pt's mother called and stated that she needs an order for a hospital bed for pt. I advised mom to let us know where to send it too. Please advise.

## 2019-11-10 NOTE — Telephone Encounter (Signed)
I filled that out earlier this week and it was picked up off my desk.

## 2019-11-10 NOTE — Telephone Encounter (Deleted)
Pt's mother called and stated that

## 2019-11-24 ENCOUNTER — Ambulatory Visit (INDEPENDENT_AMBULATORY_CARE_PROVIDER_SITE_OTHER): Payer: Medicaid Other | Admitting: Family Medicine

## 2019-11-24 ENCOUNTER — Other Ambulatory Visit: Payer: Self-pay

## 2019-11-24 VITALS — BP 120/70 | HR 86 | Temp 98.0°F | Ht 75.0 in | Wt 387.0 lb

## 2019-11-24 DIAGNOSIS — Z0001 Encounter for general adult medical examination with abnormal findings: Secondary | ICD-10-CM | POA: Diagnosis not present

## 2019-11-24 DIAGNOSIS — I10 Essential (primary) hypertension: Secondary | ICD-10-CM

## 2019-11-24 DIAGNOSIS — F952 Tourette's disorder: Secondary | ICD-10-CM

## 2019-11-24 DIAGNOSIS — F840 Autistic disorder: Secondary | ICD-10-CM

## 2019-11-24 DIAGNOSIS — M7989 Other specified soft tissue disorders: Secondary | ICD-10-CM

## 2019-11-24 DIAGNOSIS — Z0000 Encounter for general adult medical examination without abnormal findings: Secondary | ICD-10-CM

## 2019-11-24 MED ORDER — HYDROCHLOROTHIAZIDE 25 MG PO TABS: 25.0000 mg | ORAL_TABLET | Freq: Every day | ORAL | 3 refills | Status: DC

## 2019-11-24 NOTE — Progress Notes (Signed)
Subjective:    Patient ID: Erik Burton, male    DOB: Dec 15, 1990, 28 y.o.   MRN: [id number]  HPI  2020-below is an excerpt from his physical from last year: Patient is here today with his mom for complete physical exam.  Patient is no longer taking Haldol for his Tourette's disorder.  He has been switched to Fanapt.  Mom states that this seems to be working better.  Previously on Haldol he was demonstrating impulse control.  He was breaking sinks, standing on furniture.  He was difficult to control.  This is improved since switching to the new medication.  Throughout today's encounter he is constantly talking.  He will spontaneously blurred sounds that are not words.  He also has motor tics particularly in his arms.  He is gained 50 pounds since I last saw him.  Mom states that they are now at home quarantined.  They are eating poorly.  She admits to feeding him soda.  They are also eating a lot of carbohydrates.  For instance most meals involve to hot pockets and a bowl of cereal plus soda.  Neither she nor he is exercising.   11/24/19 He is here today for a complete physical exam.  He weighed 367 pounds at his physical last year.  This year his weight is up to 387 pounds.  Mother lives at home with the patient.  He goes to daycare during the day.  She picks them up in the afternoon.  She admits that he drinks juice with almost every meal.  They also tend to eat a lot of fast food.  He has not exercising.  Now she is concerned due to the fact that his legs are swelling.  He has +1 pitting edema in both legs from his ankles to his knees.  His lungs today are clear to auscultation bilaterally.  Mother denies any apparent shortness of breath.  He does not seem to be struggling to breathe at times.  However I am concerned as to why this is happening.  I would be concerned about diastolic dysfunction given his morbid obesity and possibly even obstructive sleep apnea.  Mom denies any snoring.  She denies  any witnessed apneic episodes.   Past Medical History:  Diagnosis Date  . Autism   . Hypertension   . Obesity   . Tourette disorder    Current Outpatient Medications on File Prior to Visit  Medication Sig Dispense Refill  . atenolol (TENORMIN) 50 MG tablet Take 1 tablet (50 mg total) by mouth daily. 90 tablet 3  . cloNIDine (CATAPRES) 0.2 MG tablet TAKE 1 TABLET BY MOUTH TWICE DAILY 60 tablet 5  . diazepam (VALIUM) 10 MG tablet Take 1 tablet (10 mg total) by mouth every 12 (twelve) hours as needed for anxiety. Take 1 hour prior to labwork. 1 tablet 0  . FANAPT 4 MG TABS tablet TAKE 1 2 (ONE HALF) TABLET BY MOUTH AT BEDTIME FOR 2 DAYS THEN TAKE 1 TAB AT BEDTIME FOR 7 DAYS THEN TAKE 1 TAB TWICE DAILY    . fluticasone (FLONASE) 50 MCG/ACT nasal spray Place 2 sprays into both nostrils daily. 16 g 2  . VESICARE 10 MG tablet Take 1 tablet (10 mg total) by mouth daily. 30 tablet 5   No current facility-administered medications on file prior to visit.   No Known Allergies Social History   Socioeconomic History  . Marital status: Single    Spouse name: Not on file  .  Number of children: Not on file  . Years of education: Not on file  . Highest education level: Not on file  Occupational History  . Not on file  Tobacco Use  . Smoking status: Never Smoker  . Smokeless tobacco: Never Used  Substance and Sexual Activity  . Alcohol use: No  . Drug use: No  . Sexual activity: Never  Other Topics Concern  . Not on file  Social History Narrative  . Not on file   Social Determinants of Health   Financial Resource Strain:   . Difficulty of Paying Living Expenses:   Food Insecurity:   . Worried About Programme researcher, broadcasting/film/video in the Last Year:   . Barista in the Last Year:   Transportation Needs:   . Freight forwarder (Medical):   Marland Kitchen Lack of Transportation (Non-Medical):   Physical Activity:   . Days of Exercise per Week:   . Minutes of Exercise per Session:   Stress:   .  Feeling of Stress :   Social Connections:   . Frequency of Communication with Friends and Family:   . Frequency of Social Gatherings with Friends and Family:   . Attends Religious Services:   . Active Member of Clubs or Organizations:   . Attends Banker Meetings:   Marland Kitchen Marital Status:   Intimate Partner Violence:   . Fear of Current or Ex-Partner:   . Emotionally Abused:   Marland Kitchen Physically Abused:   . Sexually Abused:    Family History  Problem Relation Age of Onset  . Hypertension Mother   . Diabetes Mother   . Hyperlipidemia Mother   . Obesity Mother   . Hypertension Sister   . Hypertension Brother   . Diabetes Sister   . Obesity Sister       Review of Systems  All other systems reviewed and are negative.      Objective:   Physical Exam Vitals reviewed.  Constitutional:      General: He is not in acute distress.    Appearance: He is well-developed. He is not diaphoretic.  HENT:     Right Ear: External ear normal.     Left Ear: External ear normal.     Nose: Nose normal.     Mouth/Throat:     Pharynx: No oropharyngeal exudate.  Eyes:     General: No scleral icterus.       Right eye: No discharge.        Left eye: No discharge.     Conjunctiva/sclera: Conjunctivae normal.     Pupils: Pupils are equal, round, and reactive to light.  Neck:     Thyroid: No thyromegaly.     Vascular: No JVD.  Cardiovascular:     Rate and Rhythm: Normal rate and regular rhythm.     Heart sounds: Normal heart sounds. No murmur heard.  No friction rub. No gallop.   Pulmonary:     Effort: Pulmonary effort is normal. No respiratory distress.     Breath sounds: Normal breath sounds. No stridor. No wheezing or rales.  Chest:     Chest wall: No tenderness.  Abdominal:     General: Bowel sounds are normal. There is no distension.     Palpations: Abdomen is soft. There is no mass.     Tenderness: There is no abdominal tenderness. There is no guarding or rebound.   Musculoskeletal:        General: No tenderness. Normal  range of motion.     Cervical back: Normal range of motion and neck supple.     Right lower leg: Edema present.     Left lower leg: Edema present.  Lymphadenopathy:     Cervical: No cervical adenopathy.  Skin:    General: Skin is warm.     Coloration: Skin is not pale.     Findings: No erythema or rash.  Neurological:     Mental Status: He is alert and oriented to person, place, and time.     Cranial Nerves: No cranial nerve deficit.     Motor: No abnormal muscle tone.     Coordination: Coordination normal.     Deep Tendon Reflexes: Reflexes are normal and symmetric.  Psychiatric:        Mood and Affect: Affect is labile.        Speech: He is noncommunicative.        Behavior: Behavior is slowed.        Cognition and Memory: Memory is impaired.        Judgment: Judgment is impulsive and inappropriate.           Assessment & Plan:  General medical exam  Tourette disorder  Autism  Benign essential HTN - Plan: hydrochlorothiazide (HYDRODIURIL) 25 MG tablet, Hemoglobin A1c, CBC with Differential/Platelet, COMPLETE METABOLIC PANEL WITH GFR, Lipid panel  Class 3 severe obesity due to excess calories in adult, unspecified BMI, unspecified whether serious comorbidity present (HCC) - Plan: Hemoglobin A1c, CBC with Differential/Platelet, COMPLETE METABOLIC PANEL WITH GFR, Lipid panel  Leg swelling - Plan: Hemoglobin A1c, CBC with Differential/Platelet, COMPLETE METABOLIC PANEL WITH GFR, Lipid panel  Patient's weight is extremely high.  I am concerned about possible obstructive sleep apnea and even diastolic dysfunction causing the swelling in his legs.  I explained this in detail to his mother.  I feel that his weight is likely causing serious medical complications.  Patient is in a difficult situation due to his autism.  He refuses any lab draws or needle sticks.  He is also extremely large and therefore he cannot be  physically restrained by me or my staff in order to draw the blood due to risk to him and the staff.  He also would not be compliant with a sleep study.  Therefore I encouraged the mom to make a appointment with a nutritionist.  The patient needs to lose substantial weight in order to prevent serious medical comorbidities.  I also gave the patient a prescription for Valium 10 mg.  He can take that 30 minutes to an hour before lab work and then either come to this office or the hospital lab wherever the mother feels most comfortable and we can again try to draw the blood work.  If the patient is combative however we will not be able to check his lab work.  I recommended that he stop atenolol and switch to hydrochlorothiazide due to the swelling.  He will take 25 mg a day.  Also check CBC, CMP, fasting lipid panel, hemoglobin A1c.

## 2019-12-07 ENCOUNTER — Ambulatory Visit: Payer: Self-pay | Admitting: Dietician

## 2019-12-21 ENCOUNTER — Ambulatory Visit: Payer: Self-pay | Admitting: Dietician

## 2019-12-28 ENCOUNTER — Ambulatory Visit: Payer: Self-pay | Admitting: Skilled Nursing Facility1

## 2020-01-25 ENCOUNTER — Encounter: Payer: Medicaid Other | Attending: Family Medicine | Admitting: Skilled Nursing Facility1

## 2020-01-25 ENCOUNTER — Other Ambulatory Visit: Payer: Self-pay

## 2020-01-25 ENCOUNTER — Encounter: Payer: Self-pay | Admitting: Skilled Nursing Facility1

## 2020-01-25 DIAGNOSIS — I10 Essential (primary) hypertension: Secondary | ICD-10-CM | POA: Diagnosis not present

## 2020-01-25 NOTE — Progress Notes (Signed)
  Assessment:  Primary concerns today: weight loss.  Lower functioning Autism: Prefers Ines Bloomer  Pts mother states Shawn understands everything but does not really talk. Pts mother states he loves to dance and sing.  Pts mother states the pt has been Acting out due to her grandchildren being around living there now. Shawn hates needles so cannot get labs. pts mother states he takes all his meds every day without issues  Pts mother states Hinda Lenis priority are going to a day camp, loves his mom, radio, his room and likes to eat. pts mother states he does not like water. pts mother states she provids the food and drinks for him he rarely goes into the kitchen unless he is sneaking food but she knows when he does this.  Pt states everyone in her household is lactose intolerant.  Pts mother states he likes green beans. Pts mother states she chooses easy convenience food for herself and her son.    MEDICATIONS: see list   DIETARY INTAKE:  Usual eating pattern includes 3 meals and 3 snacks per day.  Everyday foods include none stated.  Avoided foods include most non starchy vegetables     24-hr recall:  B (8:45 AM): cereal or personal frozen pizza Snk ( AM):  L ( PM): chips and cookies and hot pocket  Snk ( PM):  D ( PM): spagetti or chicken alfredo or pizza Snk ( PM):  Beverages: water, ICE, some sneaking of soda, HI C  Usual physical activity: ADL's  Estimated energy needs: 2000 calories  Progress Towards Goal(s):  In progress.      Intervention:  Nutrition counseling. Dietitian educated pts mother on making changes for a healthy diet within the context of autism.   Goals:  Keep ICE in the fridge and give him 4 a day Keep water with lemon in the fridge: offer that first and then of he does not like it try it with honey 1 tsp at a time Try the [employer] products in the frozen food aisle   Try the impossible plant based burger meat: turn the meat into meatloaf and cover with  pasta sauce served with mashed potatoes Serve new foods with something he really likes  Try lite ranch for salads Cut back on cheese, croutons, and dressing on salads  2 packets of oatmeal for breakfast Do not change more than one food in one day Try sugar from jelly on wheat bread Try YouTube Georgann Housekeeper for activity  Chilis and stews are a great way to get veggies in  Teaching Method Utilized: Visual Auditory Hands on  Handouts given during visit include:  Detailed MyPlate  Disney Cookbook  Barriers to learning/adherence to lifestyle change: Autism  Demonstrated degree of understanding via:  Teach Back   Monitoring/Evaluation:  Dietary intake, exercise, and body weight prn.

## 2020-02-29 ENCOUNTER — Other Ambulatory Visit: Payer: Self-pay

## 2020-02-29 ENCOUNTER — Encounter: Payer: Medicaid Other | Attending: Family Medicine | Admitting: Skilled Nursing Facility1

## 2020-02-29 NOTE — Progress Notes (Signed)
  Assessment:  Primary concerns today: weight loss.  Lower functioning Autism: Prefers Raquel Sarna  Pts mother states 18 understands everything but does not really talk. Pts mother states he loves to dance and sing.  Pts mother states the pt has been Acting out due to her grandchildren being around living there now. Shawn hates needles so cannot get labs. pts mother states he takes all his meds every day without issues  Pts mother states Jacqulynn Cadet priority are going to a day camp, loves his mom, radio, his room and likes to eat. pts mother states he does not like water. pts mother states she provids the food and drinks for him he rarely goes into the kitchen unless he is sneaking food but she knows when he does this.  Pt states everyone in her household is lactose intolerant.  Pts mother states he likes green beans. Pts mother states she chooses easy convenience food for herself and her son.    Pt arrives having lost about 3 pounds since last visit. Pt states he does not eat 2 plates of food but the grandchildren. Pts mom states water + flavoring or ICE works well for him so he is drinking more water. Pts mother states he has been drinking less HiC. Pts mother his aide has been taking him walking and mom states she needs to start waking with him and [facility]. Pts mother states she is no longer giving his cookie and chips every day doing fruit instead. Pt state she noticed he likes green beans instead of mac n cheese.  Pts mother states she needs to work on not feeding him how she thinks he wants to be fed and feed him how he actually eats and should be fed.    MEDICATIONS: see list   DIETARY INTAKE:  Usual eating pattern includes 3 meals and 3 snacks per day.  Everyday foods include none stated.  Avoided foods include most non starchy vegetables     24-hr recall:  B (8:45 AM): frozen pizza Snk ( AM):  L ( PM): chips and cookies and hot pocket  Snk ( PM):  D ( PM): spagetti or chicken  alfredo or pizza Snk ( PM):  Beverages: water, ICE, some sneaking of soda, HI C  Usual physical activity: ADL's  Estimated energy needs: 2000 calories  Progress Towards Goal(s):  In progress.      Intervention:  Nutrition counseling. Dietitian educated pts mother on making changes for a healthy diet within the context of autism.   Goals: -Continue: Keep ICE in the fridge and give him 4 a day -Continue: Keep water with lemon in the fridge: offer that first and then of he does not like it try it with honey 1 tsp at a time -Try breakfast burritos: egg + onion + peppers + cheese + a little [place]  (frozen peppers and onions) (use the mission carb balance tortillas) OR 2 waffles + peanut butter + milk OR tuna melt (slice of bread with tunafish and a slice of cheese on top -instead of cookie give fruit every day and only give chips every second day -lunch ideas: leftovers or wrap  -Dinners: chili, soup or stew   Teaching Method Utilized: Visual Auditory Hands on  Handouts given during visit include:  Detailed MyPlate  Disney Cookbook  Barriers to learning/adherence to lifestyle change: Autism  Demonstrated degree of understanding via:  Teach Back   Monitoring/Evaluation:  Dietary intake, exercise, and body weight prn.

## 2020-03-08 ENCOUNTER — Other Ambulatory Visit: Payer: Self-pay

## 2020-03-08 MED ORDER — CLONIDINE HCL 0.2 MG PO TABS: 0.2000 mg | ORAL_TABLET | Freq: Two times a day (BID) | ORAL | 5 refills | Status: DC

## 2020-03-28 ENCOUNTER — Ambulatory Visit: Payer: Medicaid Other | Admitting: Skilled Nursing Facility1

## 2020-04-15 ENCOUNTER — Other Ambulatory Visit: Payer: Self-pay

## 2020-04-15 ENCOUNTER — Emergency Department (HOSPITAL_COMMUNITY)
Admission: EM | Admit: 2020-04-15 | Discharge: 2020-04-16 | Discharge status: Against Medical Advice | Payer: Medicaid Other | Attending: Emergency Medicine | Admitting: Emergency Medicine

## 2020-04-15 DIAGNOSIS — I10 Essential (primary) hypertension: Secondary | ICD-10-CM | POA: Diagnosis not present

## 2020-04-15 DIAGNOSIS — J189 Pneumonia, unspecified organism: Secondary | ICD-10-CM

## 2020-04-15 DIAGNOSIS — F840 Autistic disorder: Secondary | ICD-10-CM | POA: Insufficient documentation

## 2020-04-15 DIAGNOSIS — Z79899 Other long term (current) drug therapy: Secondary | ICD-10-CM | POA: Insufficient documentation

## 2020-04-15 DIAGNOSIS — U071 COVID-19: Secondary | ICD-10-CM | POA: Diagnosis not present

## 2020-04-15 DIAGNOSIS — R509 Fever, unspecified: Secondary | ICD-10-CM | POA: Diagnosis present

## 2020-04-15 NOTE — ED Triage Notes (Addendum)
Pt presents to ED POV. Pt c/o not eating, fever, coughing. Triage information given by mother. Pt is nonverbal autistic. resp e/u, pt unvaccinated for covid.

## 2020-04-16 ENCOUNTER — Emergency Department (HOSPITAL_COMMUNITY): Payer: Medicaid Other

## 2020-04-16 LAB — URINALYSIS, ROUTINE W REFLEX MICROSCOPIC
Bacteria, UA: NONE SEEN
Bilirubin Urine: NEGATIVE
Glucose, UA: NEGATIVE mg/dL
Hgb urine dipstick: NEGATIVE
Ketones, ur: 20 mg/dL — AB
Leukocytes,Ua: NEGATIVE
Nitrite: NEGATIVE
Protein, ur: 30 mg/dL — AB
Specific Gravity, Urine: 1.032 — ABNORMAL HIGH (ref 1.005–1.030)
pH: 5 (ref 5.0–8.0)

## 2020-04-16 LAB — RESP PANEL BY RT-PCR (FLU A&B, COVID) ARPGX2
Influenza A by PCR: NEGATIVE
Influenza B by PCR: NEGATIVE
SARS Coronavirus 2 by RT PCR: POSITIVE — AB

## 2020-04-16 MED ORDER — LORAZEPAM 1 MG PO TABS
2.0000 mg | ORAL_TABLET | Freq: Once | ORAL | Status: AC
  Administered 2020-04-16: 2 mg via ORAL
  Filled 2020-04-16: qty 2

## 2020-04-16 MED ORDER — LORAZEPAM 1 MG PO TABS
1.0000 mg | ORAL_TABLET | Freq: Once | ORAL | Status: AC
  Administered 2020-04-16: 1 mg via ORAL
  Filled 2020-04-16: qty 1

## 2020-04-16 MED ORDER — ALBUTEROL SULFATE HFA 108 (90 BASE) MCG/ACT IN AERS: 1.0000 | INHALATION_SPRAY | RESPIRATORY_TRACT | 0 refills | Status: DC | PRN

## 2020-04-16 MED ORDER — LIDOCAINE HCL (PF) 1 % IJ SOLN: 5.0000 mL | Freq: Once | INTRAMUSCULAR | Status: DC

## 2020-04-16 MED ORDER — AEROCHAMBER PLUS FLO-VU MEDIUM MISC: 1.0000 | Freq: Once | 0 refills | Status: AC

## 2020-04-16 MED ORDER — SODIUM CHLORIDE 0.9 % IV BOLUS: 1000.0000 mL | Freq: Once | INTRAVENOUS | Status: DC

## 2020-04-16 MED ORDER — LORAZEPAM 2 MG/ML IJ SOLN
2.0000 mg | Freq: Once | INTRAMUSCULAR | Status: DC
  Filled 2020-04-16: qty 1

## 2020-04-16 MED ORDER — DOXYCYCLINE HYCLATE 100 MG PO TABS
100.0000 mg | ORAL_TABLET | Freq: Once | ORAL | Status: AC
  Administered 2020-04-16: 100 mg via ORAL
  Filled 2020-04-16: qty 1

## 2020-04-16 MED ORDER — ACETAMINOPHEN 325 MG PO TABS
650.0000 mg | ORAL_TABLET | Freq: Once | ORAL | Status: AC
  Administered 2020-04-16: 650 mg via ORAL
  Filled 2020-04-16: qty 2

## 2020-04-16 MED ORDER — DOXYCYCLINE HYCLATE 100 MG PO CAPS: 100.0000 mg | ORAL_CAPSULE | Freq: Two times a day (BID) | ORAL | 0 refills | Status: DC

## 2020-04-16 NOTE — ED Notes (Signed)
Patient transported to X-ray 

## 2020-04-16 NOTE — ED Provider Notes (Signed)
[facility] EMERGENCY DEPARTMENT Provider Note   CSN: [id number] Arrival date & time: 04/15/20  0923     History Chief Complaint  Patient presents with  . Fever    Erik Burton is a 29 y.o. male.  HPI   29 year old male with history of autism, hypertension, and obesity presents today with his mother who states that he has had a fever and not felt well since last Thursday. Patient goes to a day program. He has not been vaccinated against Covid due to his autism and fear of needles. Mother has noted some congestion. He has had decreased p.o. intake. She has not noted any vomiting or diarrhea although she thought she saw some loose stool on the bed. He is nonverbal at baseline but does get up and walk to the restroom on his own and feed himself. .  Past Medical History:  Diagnosis Date  . Autism   . Hypertension   . Obesity   . Tourette disorder     Patient Active Problem List   Diagnosis Date Noted  . Tourette disorder   . Autism   . Obesity     No past surgical history on file.     Family History  Problem Relation Age of Onset  . Hypertension Mother   . Diabetes Mother   . Hyperlipidemia Mother   . Obesity Mother   . Hypertension Sister   . Hypertension Brother   . Diabetes Sister   . Obesity Sister     Social History   Tobacco Use  . Smoking status: Never Smoker  . Smokeless tobacco: Never Used  Substance Use Topics  . Alcohol use: No  . Drug use: No    Home Medications Prior to Admission medications   Medication Sig Start Date End Date Taking? Authorizing Provider  atenolol (TENORMIN) 50 MG tablet Take 1 tablet (50 mg total) by mouth daily. 12/29/18   Donita Brooks, MD  cloNIDine (CATAPRES) 0.2 MG tablet Take 1 tablet (0.2 mg total) by mouth 2 (two) times daily. 03/08/20   Donita Brooks, MD  diazepam (VALIUM) 10 MG tablet Take 1 tablet (10 mg total) by mouth every 12 (twelve) hours as needed for anxiety. Take 1 hour  prior to labwork. 11/09/17   Donita Brooks, MD  FANAPT 4 MG TABS tablet TAKE 1 2 (ONE HALF) TABLET BY MOUTH AT BEDTIME FOR 2 DAYS THEN TAKE 1 TAB AT BEDTIME FOR 7 DAYS THEN TAKE 1 TAB TWICE DAILY 10/10/18   [provider]  fluticasone (FLONASE) 50 MCG/ACT nasal spray Place 2 sprays into both nostrils daily. 09/04/19   Donita Brooks, MD  hydrochlorothiazide (HYDRODIURIL) 25 MG tablet Take 1 tablet (25 mg total) by mouth daily. Stop atenolol 11/24/19   Donita Brooks, MD  VESICARE 10 MG tablet Take 1 tablet (10 mg total) by mouth daily. 09/13/19   Donita Brooks, MD    Allergies    Patient has no known allergies.  Review of Systems   Review of Systems  Unable to perform ROS: Patient nonverbal    Physical Exam Updated Vital Signs BP (!) 127/59   Pulse 74   Temp (!) 101.7 F (38.7 C) (Rectal)   Resp 16   SpO2 100%   Physical Exam Vitals and nursing note reviewed. Exam conducted with a chaperone present.  Constitutional:      General: He is not in acute distress.    Appearance: Normal appearance. He is  obese. He is not ill-appearing.  HENT:     Head: Normocephalic.     Nose: Nose normal.     Mouth/Throat:     Mouth: Mucous membranes are moist.  Eyes:     Pupils: Pupils are equal, round, and reactive to light.  Cardiovascular:     Rate and Rhythm: Normal rate and regular rhythm.     Pulses: Normal pulses.  Pulmonary:     Effort: Pulmonary effort is normal.     Breath sounds: Normal breath sounds.  Abdominal:     General: Abdomen is flat. Bowel sounds are normal. There is no distension.     Palpations: Abdomen is soft.     Tenderness: There is no abdominal tenderness.     Hernia: No hernia is present.  Genitourinary:    Penis: Normal.   Musculoskeletal:        General: No swelling or tenderness. Normal range of motion.     Cervical back: Normal range of motion.     Right lower leg: No edema.     Left lower leg: No edema.  Skin:    General: Skin is  warm and dry.     Capillary Refill: Capillary refill takes less than 2 seconds.     Findings: No rash.  Neurological:     General: No focal deficit present.     Mental Status: He is alert.     ED Results / Procedures / Treatments   Labs (all labs ordered are listed, but only abnormal results are displayed) Labs Reviewed  RESP PANEL BY RT-PCR (FLU A&B, COVID) [id number]    EKG None  Radiology No results found.  Procedures Procedures (including critical care time)  Medications Ordered in ED Medications - No data to display  ED Course  I have reviewed the triage vital signs and the nursing notes.  Pertinent labs & imaging results that were available during my care of the patient were reviewed by me and considered in my medical decision making (see chart for details).    MDM Rules/Calculators/A&P                           Final Clinical Impression(s) / ED Diagnoses Final diagnoses:  None    Rx / DC Orders ED Discharge Orders    None       Margarita Grizzle, MD 04/16/20 1622

## 2020-04-16 NOTE — ED Notes (Signed)
Pt called x 3  No answer. 

## 2020-04-16 NOTE — Discharge Instructions (Addendum)
You brought Erik Burton to the emergency department today to be evaluated for his decreased appetite and fevers.  He tested positive for COVID-19 and his chest x-ray showed evidence of pneumonia.  He was started on the antibiotic doxycycline, please give him 1 pill twice a day for the next 7 days.  He already received 1 dose of the medication today and will only need 1 this evening.  I have also prescribed him with an albuterol inhaler and spacer, please use 1 to 2 puffs every 4 hours as needed for shortness of breath.  You may have diarrhea from the antibiotics.  It is very important that you continue to take the antibiotics even if you get diarrhea unless a medical professional tells you that you may stop taking them.  If you stop too early the bacteria you are being treated for will become stronger and you may need different, more powerful antibiotics that have more side effects and worsening diarrhea.  Please stay well hydrated and consider probiotics as they may decrease the severity of your diarrhea.    Please read instructions below.  You can alternate Tylenol/acetaminophen and Advil/ibuprofen/Motrin every 4 hours for sore throat, body aches, headache or fever.  Drink plenty of water.  Use saline nasal spray for congestion. You can give a spoonful of honey to help with cough.   Wash your hands frequently. Stay home and isolate yourself for minimum 10 days after your symptoms started, then at least 24 hours after fever-free in the absence of tylenol/ibuprofen and symptoms improving.  Follow up with your primary care provider. Return to the ER for worsening shortness of breath, if you are unable to keep liquids down, or other concerning symptoms.

## 2020-04-16 NOTE — ED Notes (Addendum)
Multiple unsuccessful attempts to give IM ativan. Pt too combative. PO ativan requested.

## 2020-04-16 NOTE — ED Notes (Signed)
Attempts by multiple RNs to start IV. Patient too combative. Medication for relaxation requested.

## 2020-04-16 NOTE — ED Notes (Signed)
Another attempt by this RN to get IV access

## 2020-04-16 NOTE — ED Provider Notes (Signed)
[facility] EMERGENCY DEPARTMENT Provider Note   CSN: [id number] Arrival date & time: 04/15/20  5885     History Chief Complaint  Patient presents with  . Fever    Erik Burton is a 29 y.o. male with history of autism, hypertension, and obesity.  Patient is autistic and nonverbal.  His mother is at bedside and information for patient's illness was collected from her.  She reports that patient has had intermittent fevers and decreased fluid/p.o. intake since last Thursday.  She reports patient has had nasal congestion, nonproductive cough, and loose stools.  She denies that he has had any vomiting.  She reports that patient has not been vaccinated due to his fears of needles.  Patient lives at home with his mother who goes to an adult day program.    HPI     Past Medical History:  Diagnosis Date  . Autism   . Hypertension   . Obesity   . Tourette disorder     Patient Active Problem List   Diagnosis Date Noted  . Tourette disorder   . Autism   . Obesity     No past surgical history on file.     Family History  Problem Relation Age of Onset  . Hypertension Mother   . Diabetes Mother   . Hyperlipidemia Mother   . Obesity Mother   . Hypertension Sister   . Hypertension Brother   . Diabetes Sister   . Obesity Sister     Social History   Tobacco Use  . Smoking status: Never Smoker  . Smokeless tobacco: Never Used  Substance Use Topics  . Alcohol use: No  . Drug use: No    Home Medications Prior to Admission medications   Medication Sig Start Date End Date Taking? Authorizing Provider  albuterol (VENTOLIN HFA) 108 (90 Base) MCG/ACT inhaler Inhale 1-2 puffs into the lungs every 4 (four) hours as needed for wheezing or shortness of breath. 04/16/20  Yes Haskel Schroeder, PA-C  cloNIDine (CATAPRES) 0.2 MG tablet Take 1 tablet (0.2 mg total) by mouth 2 (two) times daily. 03/08/20  Yes Donita Brooks, MD  doxycycline (VIBRAMYCIN)  100 MG capsule Take 1 capsule (100 mg total) by mouth 2 (two) times daily. 04/16/20  Yes Arlisha Patalano R, PA-C  FANAPT 4 MG TABS tablet Take 4 mg by mouth 2 (two) times daily. 10/10/18  Yes [provider]  hydrochlorothiazide (HYDRODIURIL) 25 MG tablet Take 1 tablet (25 mg total) by mouth daily. Stop atenolol 11/24/19  Yes Pickard, Priscille Heidelberg, MD  Spacer/Aero-Holding Chambers (AEROCHAMBER PLUS FLO-VU MEDIUM) MISC 1 each by Other route once for 1 dose. 04/16/20 04/16/20 Yes Clarice Bonaventure, Rose Phi, PA-C  atenolol (TENORMIN) 50 MG tablet Take 1 tablet (50 mg total) by mouth daily. Patient not taking: No sig reported 12/29/18   Donita Brooks, MD  diazepam (VALIUM) 10 MG tablet Take 1 tablet (10 mg total) by mouth every 12 (twelve) hours as needed for anxiety. Take 1 hour prior to labwork. Patient not taking: No sig reported 11/09/17   Donita Brooks, MD  diazepam (VALIUM) 5 MG tablet Take 5 mg by mouth daily as needed. Patient not taking: No sig reported 10/25/19   [provider]  fluticasone (FLONASE) 50 MCG/ACT nasal spray Place 2 sprays into both nostrils daily. Patient not taking: No sig reported 09/04/19   Donita Brooks, MD  VESICARE 10 MG tablet Take 1 tablet (10 mg total) by  mouth daily. Patient not taking: No sig reported 09/13/19   Donita Brooks, MD    Allergies    Patient has no known allergies.  Review of Systems   Review of Systems  Unable to perform ROS: Patient nonverbal    Physical Exam Updated Vital Signs BP 123/83   Pulse 69   Temp 100.3 F (37.9 C)   Resp 20   SpO2 99%   Physical Exam Vitals and nursing note reviewed.  Constitutional:      General: He is not in acute distress.    Appearance: He is obese. He is not ill-appearing, toxic-appearing or diaphoretic.  HENT:     Head: Normocephalic.  Eyes:     General: No scleral icterus.       Right eye: No discharge.        Left eye: No discharge.  Cardiovascular:     Rate and Rhythm:  Normal rate.  Pulmonary:     Effort: Pulmonary effort is normal.     Breath sounds: Normal breath sounds. No wheezing, rhonchi or rales.  Abdominal:     General: There is no distension.     Palpations: Abdomen is soft.     Tenderness: There is no abdominal tenderness.  Musculoskeletal:     Cervical back: Neck supple.  Skin:    General: Skin is warm and dry.  Neurological:     General: No focal deficit present.     Mental Status: He is alert.     ED Results / Procedures / Treatments   Labs (all labs ordered are listed, but only abnormal results are displayed) Labs Reviewed  RESP PANEL BY RT-PCR (FLU A&B, COVID) ARPGX2 - Abnormal; Notable for the following components:      Result Value   SARS Coronavirus 2 by RT PCR POSITIVE (*)    All other components within normal limits  URINALYSIS, ROUTINE W REFLEX MICROSCOPIC - Abnormal; Notable for the following components:   Color, Urine AMBER (*)    APPearance HAZY (*)    Specific Gravity, Urine 1.032 (*)    Ketones, ur 20 (*)    Protein, ur 30 (*)    All other components within normal limits  COMPREHENSIVE METABOLIC PANEL  CBC WITH DIFFERENTIAL/PLATELET    EKG None  Radiology DG Chest 2 View  Result Date: 04/16/2020 CLINICAL DATA:  Fever, cough EXAM: CHEST - 2 VIEW COMPARISON:  06/03/2009 FINDINGS: Consolidation noted in the right hilar and infrahilar region. Low lung volumes. Heart is normal size. No effusions. No acute bony abnormality. IMPRESSION: Right perihilar/infrahilar consolidation compatible with pneumonia. Electronically Signed   By: Charlett Nose M.D.   On: 04/16/2020 08:01    Procedures Procedures (including critical care time)  Medications Ordered in ED Medications  sodium chloride 0.9 % bolus 1,000 mL (has no administration in time range)  lidocaine (PF) (XYLOCAINE) 1 % injection 5 mL (has no administration in time range)  acetaminophen (TYLENOL) tablet 650 mg (650 mg Oral Given 04/16/20 0804)  doxycycline  (VIBRA-TABS) tablet 100 mg (100 mg Oral Given 04/16/20 0923)  LORazepam (ATIVAN) tablet 2 mg (2 mg Oral Given 04/16/20 0923)  LORazepam (ATIVAN) tablet 1 mg (1 mg Oral Given 04/16/20 1140)    ED Course  I have reviewed the triage vital signs and the nursing notes.  Pertinent labs & imaging results that were available during my care of the patient were reviewed by me and considered in my medical decision making (see chart for details).  MDM Rules/Calculators/A&P                          Alert 29 year old male in no acute distress, nontoxic appearing.  Patient has autism and is nonverbal his mother is at bedside and able to provide some information.  Presents with complaint of fever, decreased fluid/p.o. intake, nasal congestion and cough.     Concern for infection with respiratory source.  Respiratory panel, CBC, CMP and UA pending.  Patient is febrile, will give Tylenol.  Fluid bolus ordered d/t patient's decreased oral intake over the last few days.  Chest x-ray showed right perihilar/infrahilar consolidation compatible with pneumonia.  We will start patient on 100 mg doxycycline twice daily.  Antibiotics were started as infiltrate is unilateral could represent new pneumonia on top of COVID-19 infection.  COVID-19 test result was positive.  Urinalysis shows no signs of infection.  4765 patient's nurse reported could not get an IV d/t patients agitation.  Placed ativan 2mg  IM.  patient's nurse reports that he could not tolerate receiving IM Ativan shot.  Ativan was switched to p.o.  1040 discussed monoclonal antibody treatment with patient's mother.  She is amenable to this plan.  We will start this in the emergency department as patient is a poor outpatient candidate with his fear of needles.    1 mg p.o. Ativan ordered will reassess starting IV.  Still unable to obtain IV access for the patient.     Shared decision making with patient's mother, she declined to try further  sedation to gain IV access for further lab work and MAB infusion.  Patient was given information on symptomatic treatment, COVID-19 isolation and return precautions.  Patient's mother verbalized understanding and agreed with plan.  Patient was discussed with and evaluated by Dr. [id number].   Erik Burton was evaluated in Emergency Department on 04/16/2020 for the symptoms described in the history of present illness. He was evaluated in the context of the global COVID-19 pandemic, which necessitated consideration that the patient might be at risk for infection with the SARS-CoV-2 virus that causes COVID-19. Institutional protocols and algorithms that pertain to the evaluation of patients at risk for COVID-19 are in a state of rapid change based on information released by regulatory bodies including the CDC and federal and state organizations. These policies and algorithms were followed during the patient's care in the ED.  Final Clinical Impression(s) / ED Diagnoses Final diagnoses:  Community acquired pneumonia of right lung, unspecified part of lung  COVID    Rx / DC Orders ED Discharge Orders         Ordered    doxycycline (VIBRAMYCIN) 100 MG capsule  2 times daily        04/16/20 1354    Spacer/Aero-Holding Chambers (AEROCHAMBER PLUS FLO-VU MEDIUM) MISC   Once        04/16/20 1354    albuterol (VENTOLIN HFA) 108 (90 Base) MCG/ACT inhaler  Every 4 hours PRN        04/16/20 1354           04/18/20, PA-C 04/16/20 1602    04/18/20, MD 04/16/20 1620

## 2020-04-18 ENCOUNTER — Telehealth: Payer: Self-pay | Admitting: *Deleted

## 2020-04-18 NOTE — Telephone Encounter (Signed)
Needs to go to er if dehydrated.

## 2020-04-18 NOTE — Telephone Encounter (Signed)
Received call from patient mother Erik Burton.   Reports that patient has tested positive for COVID and PNA. States that he is taking ABTx for PNA, but patient refused IV MAB as it will require IV access.   States that patient is not eating or drinking much and is very weak. Reports that she fears he is dehydrated and needs to be seen.   Requested PCP contact ER and recommend sedation for IV access. Advised that if patient will go to ER, the MD on call there can determine need.   Patient mother states that she will take him back to ER for evaluation of worsening Sx.

## 2020-04-30 ENCOUNTER — Other Ambulatory Visit: Payer: Self-pay

## 2020-04-30 ENCOUNTER — Other Ambulatory Visit: Payer: Medicaid Other

## 2020-04-30 DIAGNOSIS — Z20822 Contact with and (suspected) exposure to covid-19: Secondary | ICD-10-CM

## 2020-05-02 ENCOUNTER — Ambulatory Visit: Payer: Medicaid Other | Admitting: Skilled Nursing Facility1

## 2020-05-05 LAB — NOVEL CORONAVIRUS, NAA: SARS-CoV-2, NAA: NOT DETECTED

## 2020-06-12 ENCOUNTER — Ambulatory Visit: Payer: Medicaid Other | Admitting: Skilled Nursing Facility1

## 2020-07-02 ENCOUNTER — Telehealth: Payer: Self-pay | Admitting: Family Medicine

## 2020-07-02 NOTE — Telephone Encounter (Signed)
Received fax, forms have been put in pcp green folder for completion

## 2020-07-02 NOTE — Telephone Encounter (Signed)
[employer] called stating that an authorization had been faxed for pt to receive personal wipes. Form for Medical necessity needs to be filled out by dr.

## 2020-07-30 ENCOUNTER — Telehealth: Payer: Self-pay | Admitting: Family Medicine

## 2020-07-30 NOTE — Telephone Encounter (Signed)
Received message from [employer] regarding Rx for incontinence supplies: order missing information. Please advise at [phone] ext [phone].

## 2020-07-31 ENCOUNTER — Encounter: Payer: Self-pay | Admitting: *Deleted

## 2020-07-31 NOTE — Telephone Encounter (Signed)
Additional information added and faxed to company.

## 2020-11-25 ENCOUNTER — Other Ambulatory Visit: Payer: Self-pay

## 2020-11-25 ENCOUNTER — Ambulatory Visit (INDEPENDENT_AMBULATORY_CARE_PROVIDER_SITE_OTHER): Payer: Medicaid Other | Admitting: Family Medicine

## 2020-11-25 ENCOUNTER — Encounter: Payer: Self-pay | Admitting: Family Medicine

## 2020-11-25 VITALS — BP 142/82 | HR 70 | Temp 99.0°F | Resp 16 | Ht 76.0 in | Wt 394.0 lb

## 2020-11-25 DIAGNOSIS — I10 Essential (primary) hypertension: Secondary | ICD-10-CM

## 2020-11-25 DIAGNOSIS — F840 Autistic disorder: Secondary | ICD-10-CM

## 2020-11-25 DIAGNOSIS — Z0001 Encounter for general adult medical examination with abnormal findings: Secondary | ICD-10-CM

## 2020-11-25 DIAGNOSIS — Z0000 Encounter for general adult medical examination without abnormal findings: Secondary | ICD-10-CM

## 2020-11-25 MED ORDER — DIAZEPAM 10 MG PO TABS: 10.0000 mg | ORAL_TABLET | Freq: Two times a day (BID) | ORAL | 0 refills | Status: AC | PRN

## 2020-11-25 NOTE — Progress Notes (Addendum)
Subjective:    Patient ID: Erik Burton, male    DOB: 25-May-1990, 30 y.o.   MRN: [id number]  HPI  2020-below is an excerpt from his physical from last year: Patient is here today with his mom for complete physical exam.  Patient is no longer taking Haldol for his Tourette's disorder.  He has been switched to Fanapt.  Mom states that this seems to be working better.  Previously on Haldol he was demonstrating impulse control.  He was breaking sinks, standing on furniture.  He was difficult to control.  This is improved since switching to the new medication.  Throughout today's encounter he is constantly talking.  He will spontaneously blurred sounds that are not words.  He also has motor tics particularly in his arms.  He is gained 50 pounds since I last saw him.  Mom states that they are now at home quarantined.  They are eating poorly.  She admits to feeding him soda.  They are also eating a lot of carbohydrates.  For instance most meals involve to hot pockets and a bowl of cereal plus soda.  Neither she nor he is exercising.   11/24/19 He is here today for a complete physical exam.  He weighed 367 pounds at his physical last year.  This year his weight is up to 387 pounds.  Mother lives at home with the patient.  He goes to daycare during the day.  She picks them up in the afternoon.  She admits that he drinks juice with almost every meal.  They also tend to eat a lot of fast food.  He has not exercising.  Now she is concerned due to the fact that his legs are swelling.  He has +1 pitting edema in both legs from his ankles to his knees.  His lungs today are clear to auscultation bilaterally.  Mother denies any apparent shortness of breath.  He does not seem to be struggling to breathe at times.  However I am concerned as to why this is happening.  I would be concerned about diastolic dysfunction given his morbid obesity and possibly even obstructive sleep apnea.  Mom denies any snoring.  She denies  any witnessed apneic episodes.  At that time, my plan was: Patient's weight is extremely high.  I am concerned about possible obstructive sleep apnea and even diastolic dysfunction causing the swelling in his legs.  I explained this in detail to his mother.  I feel that his weight is likely causing serious medical complications.  Patient is in a difficult situation due to his autism.  He refuses any lab draws or needle sticks.  He is also extremely large and therefore he cannot be physically restrained by me or my staff in order to draw the blood due to risk to him and the staff.  He also would not be compliant with a sleep study.  Therefore I encouraged the mom to make a appointment with a nutritionist.  The patient needs to lose substantial weight in order to prevent serious medical comorbidities.  I also gave the patient a prescription for Valium 10 mg.  He can take that 30 minutes to an hour before lab work and then either come to this office or the hospital lab wherever the mother feels most comfortable and we can again try to draw the blood work.  If the patient is combative however we will not be able to check his lab work.  I recommended that he  stop atenolol and switch to hydrochlorothiazide due to the swelling.  He will take 25 mg a day.  Also check CBC, CMP, fasting lipid panel, hemoglobin A1c.  11/25/20 Wt Readings from Last 3 Encounters:  02/29/20 (!) 391 lb (177.4 kg)  01/25/20 (!) 394 lb (178.7 kg)  11/24/19 (!) 387 lb (175.5 kg)   Patient is here today with his mother.  He is unable to provide any history due to his learning disability and autism.  Patient lives with his mother.  Also in the household is his twin sister who is 73 years old and her children.  Mom admits that she is under a lot of stress caring for such a big family.  The sister has depression and is also dealing with substance abuse and therefore this creates more stress for the mother.  The patient is going to a daycare every  day.  However mom admits that when he gets home from the daycare she is giving him a snack.  Essentially it sounds like he is eating 4 meals a day.  Unfortunately his weight has gone even higher and is now up to 394 pounds.  He is not exercising.  They are still eating junk food and drinking soda.  Mom admits that she has "been bad".  She is a very sweet lady and I do believe that her heart is in the right place however I feel that she sees feeding the patient is a way of showing her loving caring for him.  And although she means well unfortunately is contributing to morbid obesity.  She also has not looked into any long-term care planning.  She to has medical issues including diabetes.  However if she were to pass away today, there is no plan in place as to what would happen with the patient   Past Medical History:  Diagnosis Date   Autism    Hypertension    Obesity    Tourette disorder    Current Outpatient Medications on File Prior to Visit  Medication Sig Dispense Refill   albuterol (VENTOLIN HFA) 108 (90 Base) MCG/ACT inhaler Inhale 1-2 puffs into the lungs every 4 (four) hours as needed for wheezing or shortness of breath. 6.7 g 0   atenolol (TENORMIN) 50 MG tablet Take 1 tablet (50 mg total) by mouth daily. (Patient not taking: No sig reported) 90 tablet 3   cloNIDine (CATAPRES) 0.2 MG tablet Take 1 tablet (0.2 mg total) by mouth 2 (two) times daily. 60 tablet 5   diazepam (VALIUM) 10 MG tablet Take 1 tablet (10 mg total) by mouth every 12 (twelve) hours as needed for anxiety. Take 1 hour prior to labwork. (Patient not taking: No sig reported) 1 tablet 0   diazepam (VALIUM) 5 MG tablet Take 5 mg by mouth daily as needed. (Patient not taking: No sig reported)     doxycycline (VIBRAMYCIN) 100 MG capsule Take 1 capsule (100 mg total) by mouth 2 (two) times daily. 20 capsule 0   FANAPT 4 MG TABS tablet Take 4 mg by mouth 2 (two) times daily.     fluticasone (FLONASE) 50 MCG/ACT nasal spray  Place 2 sprays into both nostrils daily. (Patient not taking: No sig reported) 16 g 2   hydrochlorothiazide (HYDRODIURIL) 25 MG tablet Take 1 tablet (25 mg total) by mouth daily. Stop atenolol 90 tablet 3   VESICARE 10 MG tablet Take 1 tablet (10 mg total) by mouth daily. (Patient not taking: No sig reported) 30  tablet 5   No current facility-administered medications on file prior to visit.   No Known Allergies Social History   Socioeconomic History   Marital status: Single    Spouse name: Not on file   Number of children: Not on file   Years of education: Not on file   Highest education level: Not on file  Occupational History   Not on file  Tobacco Use   Smoking status: Never   Smokeless tobacco: Never  Substance and Sexual Activity   Alcohol use: No   Drug use: No   Sexual activity: Never  Other Topics Concern   Not on file  Social History Narrative   Not on file   Social Determinants of Health   Financial Resource Strain: Not on file  Food Insecurity: Not on file  Transportation Needs: Not on file  Physical Activity: Not on file  Stress: Not on file  Social Connections: Not on file  Intimate Partner Violence: Not on file   Family History  Problem Relation Age of Onset   Hypertension Mother    Diabetes Mother    Hyperlipidemia Mother    Obesity Mother    Hypertension Sister    Hypertension Brother    Diabetes Sister    Obesity Sister       Review of Systems     Objective:   Physical Exam Vitals reviewed.  Constitutional:      General: He is not in acute distress.    Appearance: Normal appearance. He is obese. He is not ill-appearing, toxic-appearing or diaphoretic.  HENT:     Head: Normocephalic and atraumatic.     Right Ear: Tympanic membrane and ear canal normal.     Left Ear: Tympanic membrane and ear canal normal.  Neck:     Vascular: No carotid bruit.  Cardiovascular:     Rate and Rhythm: Normal rate and regular rhythm.     Pulses: Normal  pulses.     Heart sounds: Normal heart sounds. No murmur heard.   No friction rub. No gallop.  Pulmonary:     Effort: Pulmonary effort is normal. No respiratory distress.     Breath sounds: Normal breath sounds. No wheezing, rhonchi or rales.  Abdominal:     General: Abdomen is flat. Bowel sounds are normal. There is no distension.     Palpations: Abdomen is soft.     Tenderness: There is no abdominal tenderness. There is no guarding.  Musculoskeletal:     Cervical back: Neck supple.     Right lower leg: Edema present.     Left lower leg: Edema present.  Lymphadenopathy:     Cervical: No cervical adenopathy.  Neurological:     General: No focal deficit present.     Mental Status: Mental status is at baseline.     Cranial Nerves: No cranial nerve deficit.     Sensory: No sensory deficit.     Motor: No weakness.     Gait: Gait normal.          Assessment & Plan:  General medical exam  Class 3 severe obesity due to excess calories in adult, unspecified BMI, unspecified whether serious comorbidity present (HCC) - Plan: CBC with Differential/Platelet, COMPLETE METABOLIC PANEL WITH GFR, Lipid panel, Hemoglobin A1c  Benign essential HTN - Plan: CBC with Differential/Platelet, COMPLETE METABOLIC PANEL WITH GFR, Lipid panel, Hemoglobin A1c  Autism Blood pressure is extremely high today.  I recommended that mother check blood pressure every day for  1 week and report the values to me.  If consistently greater than 140/90 I would start the patient on amlodipine in addition to hydrochlorothiazide.  #2 I would like her to give the patient Valium 10 mg 30 minutes before lab work and then take the patient to a lab to have blood drawn.  I will check a CBC CMP lipid panel and A1c.  Third, I would like the patient to have a long-term plan in place.  I recommended that the mother discuss with his caseworker assisted living facilities or skilled nursing facilities.  At some point, the patient's  mother is going to be unable to care for him any longer at home.  I recommended that she come up with a plan and have it codified so that when she is no longer able to provide care, there will be a plan in place to help take care of this patient.  Last, his weight is my biggest concern.  She must stop feeding him extra meals.  Also recommended tremendous dietary changes.Patient is incontinent of urine and stool and noncommunicative.  He requires incontinence supplies.

## 2020-12-04 ENCOUNTER — Telehealth: Payer: Self-pay | Admitting: Family Medicine

## 2020-12-04 NOTE — Telephone Encounter (Signed)
Returned call to Tyra at [employer]; she left a voicemail message to confirm our receipt of the fax sent over last week of the [facility] Medicaid prior approval form. Fax also contained request for notes and letter of necessity.   Tyra will refax request now and call back immediately to verify receipt. Please advise at [phone] and ask for Tyra.

## 2020-12-04 NOTE — Telephone Encounter (Signed)
Fax received (2 pages) and placed in provider's folder. Received confirmation call from Tyra.

## 2020-12-04 NOTE — Telephone Encounter (Signed)
Forms routed to provider.   Also noted that chart notes are requested detailing medical necessity of incontinence supplies.   Please advise.

## 2020-12-04 NOTE — Telephone Encounter (Signed)
Fax received and placed in hanging folder at nurse's desk.

## 2020-12-05 NOTE — Telephone Encounter (Signed)
Notes printed and will be faxed with order.

## 2020-12-10 ENCOUNTER — Telehealth: Payer: Self-pay | Admitting: Family Medicine

## 2020-12-10 NOTE — Telephone Encounter (Signed)
Received voicemail message from Tyra at [facility]. Received [place] Medicaid form and patient notes; still needs letter of medical necessity. Please advise at [phone] and ask for Tyra.

## 2020-12-11 ENCOUNTER — Encounter: Payer: Self-pay | Admitting: *Deleted

## 2020-12-11 NOTE — Telephone Encounter (Signed)
CMN received and routed to provider for completion.

## 2020-12-26 ENCOUNTER — Telehealth: Payer: Self-pay | Admitting: *Deleted

## 2020-12-26 NOTE — Telephone Encounter (Signed)
Received call from patient mother.   Reports that BP readings are as follows: BP HR  113/79 93  152/92 80  140/93 92  153/ 96 82  146/93 87  162/102 112  160/103 119  150/94 107  146/98 111

## 2020-12-27 MED ORDER — AMLODIPINE BESYLATE 10 MG PO TABS: 10.0000 mg | ORAL_TABLET | Freq: Every day | ORAL | 1 refills | Status: DC

## 2020-12-27 NOTE — Addendum Note (Signed)
Addended by: Phillips Odor on: 12/27/2020 04:47 PM   Modules accepted: Orders

## 2020-12-27 NOTE — Telephone Encounter (Addendum)
Call placed to patient and patient mother made aware via VM.

## 2021-01-25 ENCOUNTER — Other Ambulatory Visit: Payer: Self-pay | Admitting: Family Medicine

## 2021-01-25 DIAGNOSIS — I10 Essential (primary) hypertension: Secondary | ICD-10-CM

## 2021-02-06 ENCOUNTER — Other Ambulatory Visit: Payer: Self-pay

## 2021-02-06 ENCOUNTER — Encounter: Payer: Self-pay | Admitting: Family Medicine

## 2021-02-06 ENCOUNTER — Ambulatory Visit (INDEPENDENT_AMBULATORY_CARE_PROVIDER_SITE_OTHER): Payer: Medicaid Other | Admitting: Family Medicine

## 2021-02-06 VITALS — BP 128/90 | HR 74 | Temp 98.5°F | Ht 76.0 in | Wt >= 6400 oz

## 2021-02-06 DIAGNOSIS — I10 Essential (primary) hypertension: Secondary | ICD-10-CM | POA: Diagnosis not present

## 2021-02-06 NOTE — Progress Notes (Signed)
Subjective:    Patient ID: Erik Burton, male    DOB: 1990/05/21, 30 y.o.   MRN: [id number]  HPI Please see the last visit.  Patient is currently on clonidine as well as hydrochlorothiazide.  Despite being on these 2 medications, his blood pressure was still extremely high.  Therefore we added amlodipine 10 mg a day over the telephone in September.  He is here today to recheck his blood pressure.  My nurse got his blood pressure adequately controlled at 128/90.  His mom has been checking his blood pressure at home using an automatic cuff.  Blood pressure has been fluctuating between 120 and 145.  The diastolic blood pressure has been between 70 and 90.  Mom is given him 2, 10 mg Valium's today in an effort to try to draw blood work.  Therefore I did not perform an exam to avoid agitating the patient.  I then have my lab tech come in and we tried to draw blood with a butterfly needle.  Mom was holding his left arm.  I was holding his right arm and his left arm.  Despite our best effort, the patient became agitated and was unwilling to hold still.  He is extremely strong and therefore we aborted the blood draw to avoid any injury to the patient or staff Past Medical History:  Diagnosis Date  . Autism   . Hypertension   . Obesity   . Tourette disorder    Current Outpatient Medications on File Prior to Visit  Medication Sig Dispense Refill  . amLODipine (NORVASC) 10 MG tablet Take 1 tablet (10 mg total) by mouth daily. 90 tablet 1  . cloNIDine (CATAPRES) 0.2 MG tablet Take 1 tablet (0.2 mg total) by mouth 2 (two) times daily. 60 tablet 5  . FANAPT 4 MG TABS tablet Take 4 mg by mouth 2 (two) times daily.    . hydrochlorothiazide (HYDRODIURIL) 25 MG tablet TAKE 1 TABLET BY MOUTH ONCE DAILY. STOP ATENOLOL 90 tablet 0  . diazepam (VALIUM) 10 MG tablet Take 1 tablet (10 mg total) by mouth every 12 (twelve) hours as needed for anxiety (take 30 minutes prior to labs). 10 tablet 0   No current  facility-administered medications on file prior to visit.   No Known Allergies Social History   Socioeconomic History  . Marital status: Single    Spouse name: Not on file  . Number of children: Not on file  . Years of education: Not on file  . Highest education level: Not on file  Occupational History  . Not on file  Tobacco Use  . Smoking status: Never  . Smokeless tobacco: Never  Substance and Sexual Activity  . Alcohol use: No  . Drug use: No  . Sexual activity: Never  Other Topics Concern  . Not on file  Social History Narrative  . Not on file   Social Determinants of Health   Financial Resource Strain: Not on file  Food Insecurity: Not on file  Transportation Needs: Not on file  Physical Activity: Not on file  Stress: Not on file  Social Connections: Not on file  Intimate Partner Violence: Not on file      Review of Systems  All other systems reviewed and are negative.     Objective:   Physical Exam Vitals reviewed.  Cardiovascular:     Rate and Rhythm: Normal rate and regular rhythm.     Heart sounds: Normal heart sounds.  Pulmonary:  Effort: Pulmonary effort is normal.     Breath sounds: Normal breath sounds.  Musculoskeletal:     Right lower leg: No edema.     Left lower leg: No edema.  Neurological:     Mental Status: He is alert.         Assessment & Plan:

## 2021-02-13 IMAGING — CR DG CHEST 2V
2 series · 2 of 2 positions shown · non-contrast
Comparison: 06/03/2009

CLINICAL DATA: Fever, cough

EXAM:
CHEST - 2 VIEW

[chest pa]
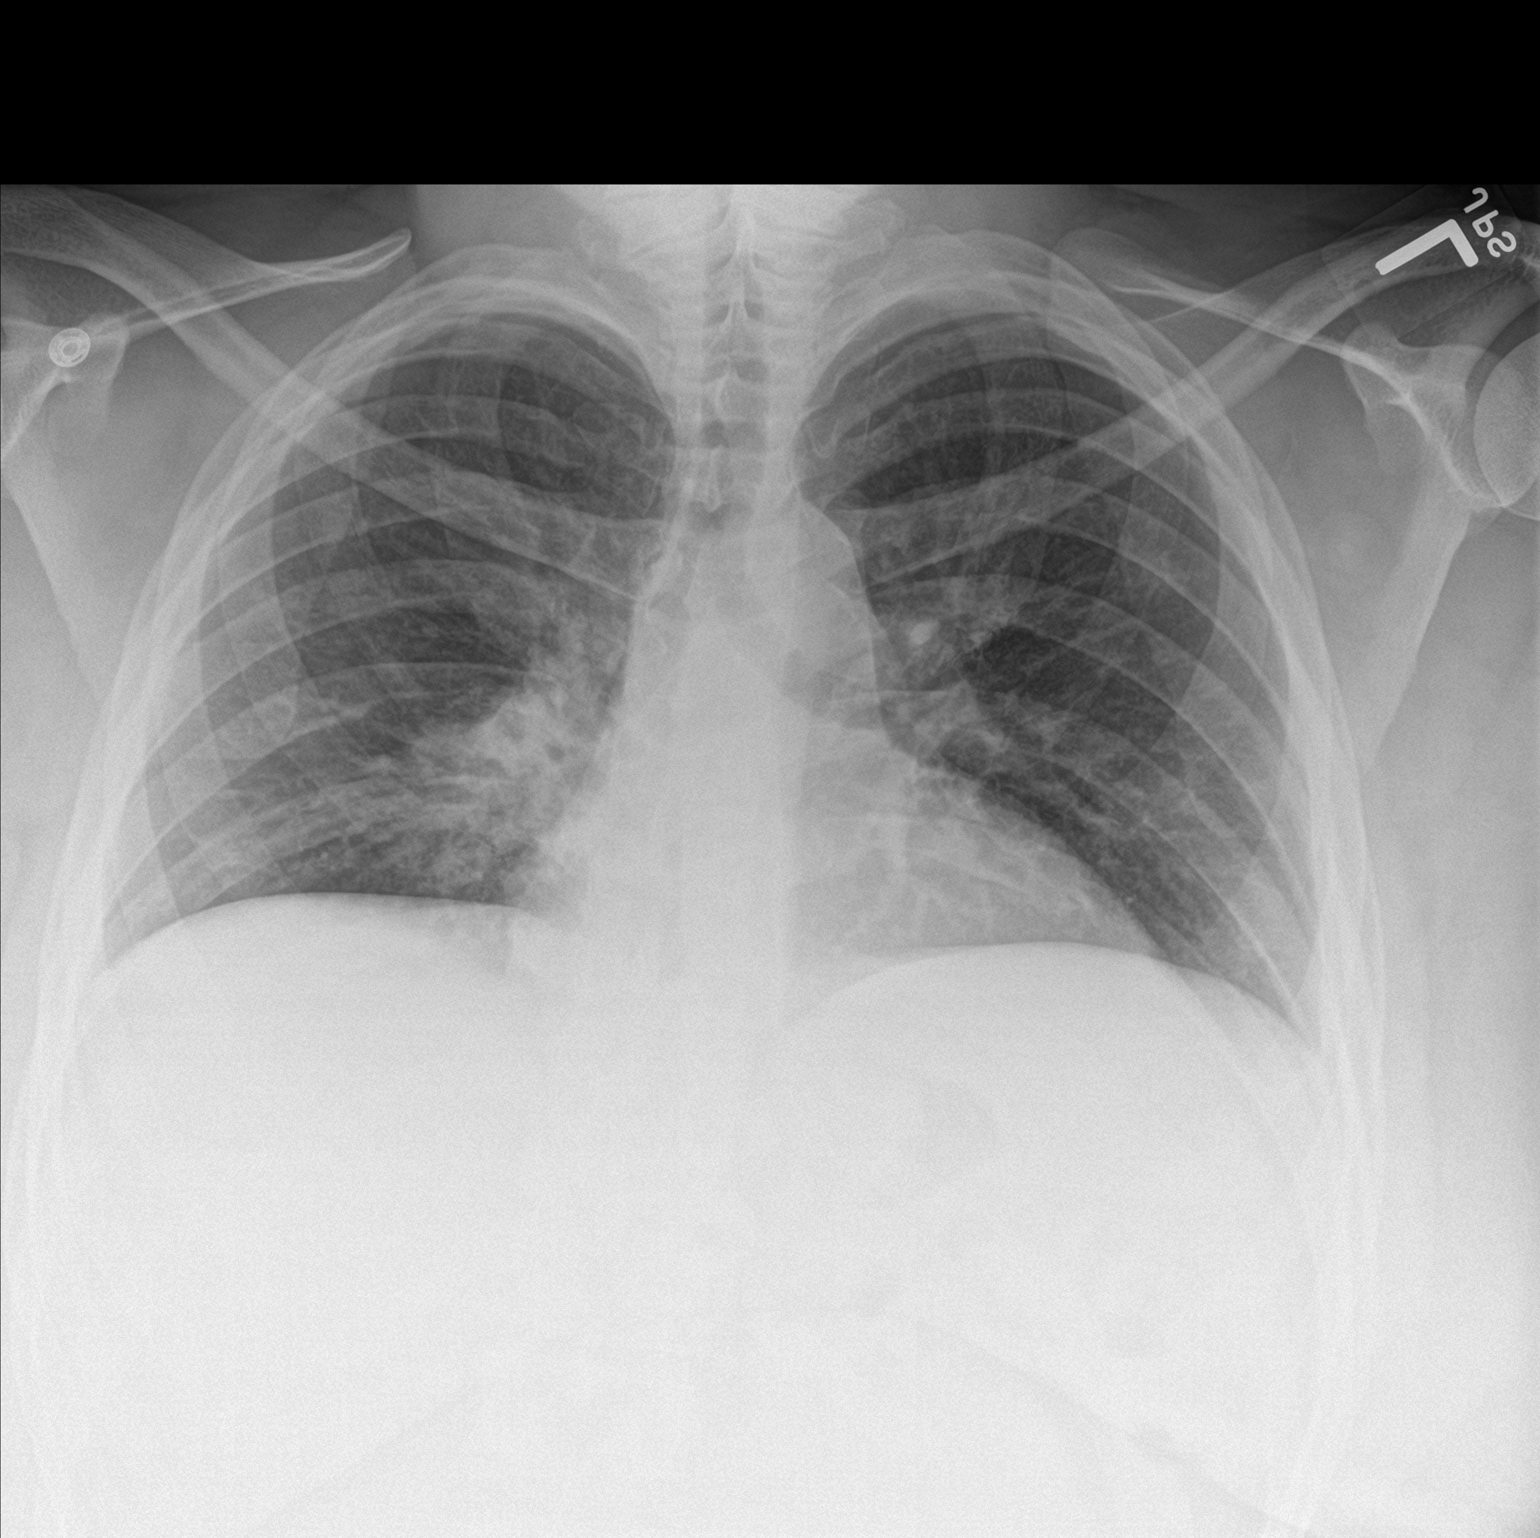

[chest lat]
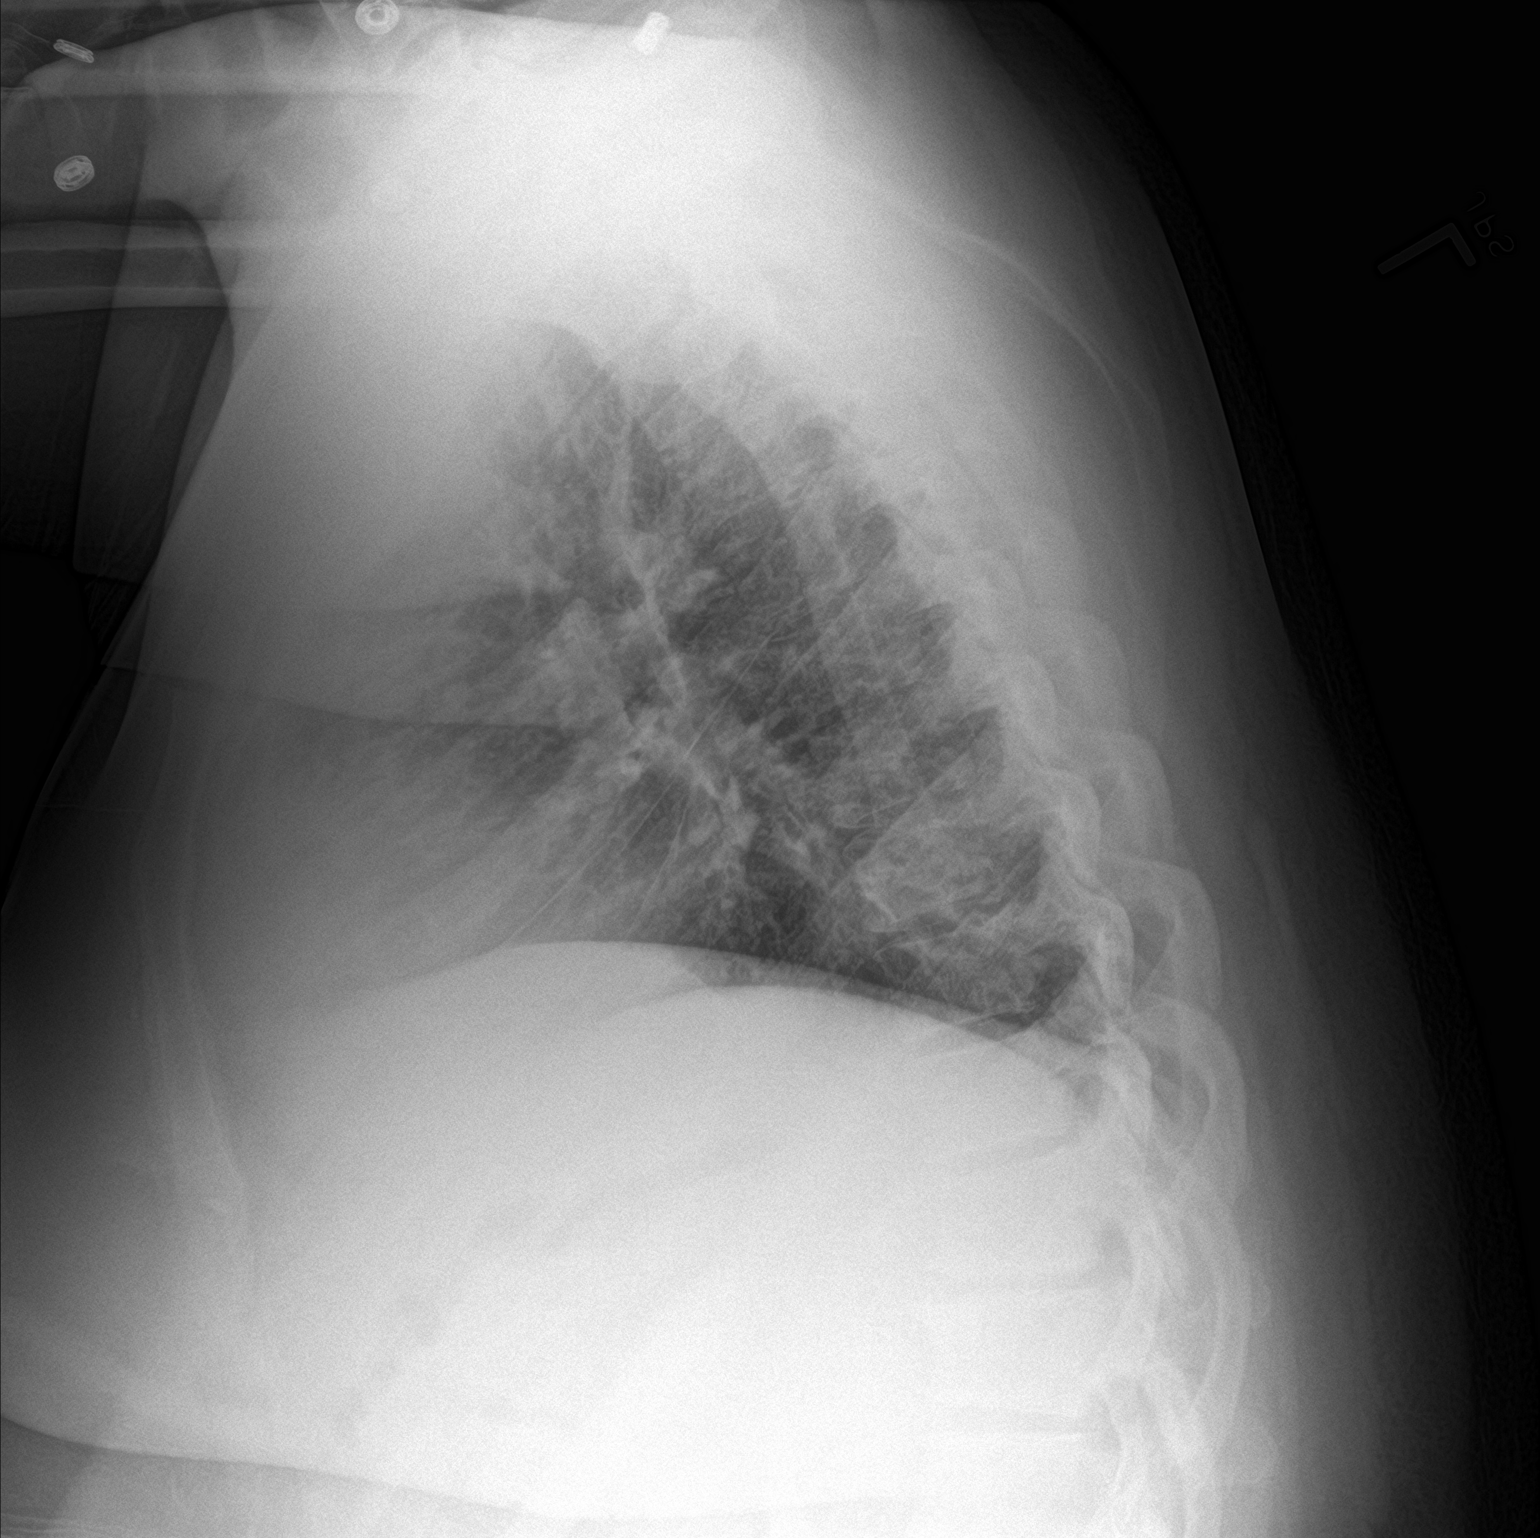

[2 of 2 positions shown; findings below may reference images not displayed]

FINDINGS: Consolidation noted in the right hilar and infrahilar region. Low
lung volumes. Heart is normal size. No effusions. No acute bony
abnormality.
IMPRESSION: Right perihilar/infrahilar consolidation compatible with pneumonia.

## 2021-04-29 ENCOUNTER — Other Ambulatory Visit: Payer: Self-pay | Admitting: Family Medicine

## 2021-04-29 DIAGNOSIS — I10 Essential (primary) hypertension: Secondary | ICD-10-CM

## 2021-06-25 ENCOUNTER — Other Ambulatory Visit: Payer: Self-pay | Admitting: Family Medicine

## 2021-07-28 ENCOUNTER — Telehealth: Payer: Self-pay | Admitting: Family Medicine

## 2021-07-28 NOTE — Telephone Encounter (Signed)
Received call from patient's mother to follow up on order form sent from [employer] for provider to approve refill of pads for patient. Patient's mother states she's unsure if name of company has been changed but forms sent about 1 month ago. Copy of forms here at front desk. Patient's mother will call company to follow up and check on status of order.  ?

## 2021-07-29 ENCOUNTER — Telehealth: Payer: Self-pay

## 2021-07-29 NOTE — Telephone Encounter (Signed)
Received form from [employer] re pts incontinence supply. ? ?Send out letter attached with form. Gave to front desk to fax back 07/29/21  ?

## 2021-08-08 ENCOUNTER — Telehealth: Payer: Self-pay

## 2021-08-08 NOTE — Telephone Encounter (Signed)
[employer], following up on Incontinence form ? ?[phone] ?

## 2021-08-12 NOTE — Telephone Encounter (Signed)
Letter was sent out 07/29/21. ? ?2nd letter was sent up to the front to be fax over to [place] ?

## 2021-08-29 ENCOUNTER — Other Ambulatory Visit: Payer: Self-pay | Admitting: Family Medicine

## 2021-09-01 NOTE — Telephone Encounter (Signed)
last RF 06/26/21 #90 has enough med until 09/26/21 ? ?Requested Prescriptions  ?Refused Prescriptions Disp Refills  ?? amLODipine (NORVASC) 10 MG tablet [Pharmacy Med Name: amLODIPine Besylate 10 MG Oral Tablet] 90 tablet 0  ?  Sig: Take 1 tablet by mouth once daily  ?  ? Cardiovascular: Calcium Channel Blockers 2 Failed - 08/29/2021 10:12 AM  ?  ?  Failed - Last BP in normal range  ?  BP Readings from Last 1 Encounters:  ?02/06/21 128/90  ?   ?  ?  Failed - Valid encounter within last 6 months  ?  Recent Outpatient Visits   ?      ? 6 months ago Benign essential HTN  ? [facility] Pickard, Priscille Heidelberg, MD  ? 9 months ago General medical exam  ? [facility] Donita Brooks, MD  ? 1 year ago General medical exam  ? [facility] Donita Brooks, MD  ? 1 year ago Autism  ? [facility] Pickard, Priscille Heidelberg, MD  ? 2 years ago Benign essential HTN  ? [facility] Pickard, Priscille Heidelberg, MD  ?  ?  ?Future Appointments   ?        ? In 3 days Pickard, Priscille Heidelberg, MD [facility]  ?  ? ?  ?  ?  Passed - Last Heart Rate in normal range  ?  Pulse Readings from Last 1 Encounters:  ?02/06/21 74  ?   ?  ?  ? ? ?

## 2021-09-04 ENCOUNTER — Ambulatory Visit (INDEPENDENT_AMBULATORY_CARE_PROVIDER_SITE_OTHER): Payer: Medicaid Other | Admitting: Family Medicine

## 2021-09-04 DIAGNOSIS — I10 Essential (primary) hypertension: Secondary | ICD-10-CM | POA: Diagnosis not present

## 2021-09-04 MED ORDER — CLONIDINE HCL 0.2 MG PO TABS: 0.2000 mg | ORAL_TABLET | Freq: Two times a day (BID) | ORAL | 3 refills | Status: DC

## 2021-09-04 MED ORDER — HYDROCHLOROTHIAZIDE 25 MG PO TABS: 25.0000 mg | ORAL_TABLET | Freq: Every day | ORAL | 3 refills | Status: DC

## 2021-09-04 MED ORDER — AMLODIPINE BESYLATE 10 MG PO TABS: 10.0000 mg | ORAL_TABLET | Freq: Every day | ORAL | 3 refills | Status: DC

## 2021-09-04 MED ORDER — DOXAZOSIN MESYLATE 2 MG PO TABS: 2.0000 mg | ORAL_TABLET | Freq: Every day | ORAL | 5 refills | Status: DC

## 2021-09-04 NOTE — Progress Notes (Signed)
   Subjective:    Patient ID: Erik Burton, male    DOB: 1991/03/19, 31 y.o.   MRN: [id number]  HPI By taking clonidine twice daily, hydrochlorothiazide, and amlodipine, the patient's blood pressure remains elevated.  Systolic blood pressure greater than 152 today.  He continues to remain overweight.  His weight today is 395 pounds.  He was 400 pounds at his last visit.  Mom states that he is exercising.  She is not drinking sodas or sugar-containing beverages.  However she states it may be the food that he eats.  She is feeding him hot pockets, and pasta.  The diet sounds rich in carbohydrates and I suspect the quantity is too much. Past Medical History:  Diagnosis Date   Autism    Hypertension    Obesity    Tourette disorder    Current Outpatient Medications on File Prior to Visit  Medication Sig Dispense Refill   diazepam (VALIUM) 10 MG tablet Take 1 tablet (10 mg total) by mouth every 12 (twelve) hours as needed for anxiety (take 30 minutes prior to labs). 10 tablet 0   FANAPT 4 MG TABS tablet Take 4 mg by mouth 2 (two) times daily.     No current facility-administered medications on file prior to visit.   No Known Allergies Social History   Socioeconomic History   Marital status: Single    Spouse name: Not on file   Number of children: Not on file   Years of education: Not on file   Highest education level: Not on file  Occupational History   Not on file  Tobacco Use   Smoking status: Never   Smokeless tobacco: Never  Substance and Sexual Activity   Alcohol use: No   Drug use: No   Sexual activity: Never  Other Topics Concern   Not on file  Social History Narrative   Not on file   Social Determinants of Health   Financial Resource Strain: Not on file  Food Insecurity: Not on file  Transportation Needs: Not on file  Physical Activity: Not on file  Stress: Not on file  Social Connections: Not on file  Intimate Partner Violence: Not on file      Review  of Systems  All other systems reviewed and are negative.     Objective:   Physical Exam Vitals reviewed.  Cardiovascular:     Rate and Rhythm: Normal rate and regular rhythm.     Heart sounds: Normal heart sounds.  Pulmonary:     Effort: Pulmonary effort is normal.     Breath sounds: Normal breath sounds.  Musculoskeletal:     Right lower leg: No edema.     Left lower leg: No edema.  Neurological:     Mental Status: He is alert.         Assessment & Plan:   Benign essential HTN - Plan: hydrochlorothiazide (HYDRODIURIL) 25 MG tablet I will add doxazosin 2 mg daily to amlodipine and hydrochlorothiazide and clonidine in an attempt to get his systolic blood pressure under 998 to prevent the risk of long-term kidney damage and cardiovascular disease.  I have asked the mother to check his blood pressure daily and notify me of the values in few weeks and we can make an adjustment if necessary.

## 2021-12-01 ENCOUNTER — Encounter: Payer: Medicaid Other | Admitting: Family Medicine

## 2021-12-23 ENCOUNTER — Ambulatory Visit (INDEPENDENT_AMBULATORY_CARE_PROVIDER_SITE_OTHER): Payer: Medicaid Other | Admitting: Family Medicine

## 2021-12-23 VITALS — BP 126/72 | HR 76 | Ht 76.0 in | Wt 393.0 lb

## 2021-12-23 DIAGNOSIS — Z0000 Encounter for general adult medical examination without abnormal findings: Secondary | ICD-10-CM | POA: Diagnosis not present

## 2021-12-23 DIAGNOSIS — I10 Essential (primary) hypertension: Secondary | ICD-10-CM | POA: Diagnosis not present

## 2021-12-23 DIAGNOSIS — F840 Autistic disorder: Secondary | ICD-10-CM

## 2021-12-23 MED ORDER — DOXAZOSIN MESYLATE 4 MG PO TABS: 4.0000 mg | ORAL_TABLET | Freq: Every day | ORAL | 3 refills | Status: DC

## 2021-12-23 NOTE — Progress Notes (Signed)
Subjective:    Patient ID: Erik Burton, male    DOB: 06-Nov-1990, 31 y.o.   MRN: [id number]  HPI Patient is a 31 year old African-American gentleman here today with his mother.  He has a history of severe autism.  He is noncommunicative.  He is currently on amlodipine, doxazosin, and hydrochlorothiazide for hypertension.  His blood pressure today is well controlled at 126/72.  Due to his large size strength and severe autism, we have been unable to check lab work as the patient is mildly afraid of needles.  He is currently only taking 2 mg a day of doxazosin.  I had a long discussion today with his mom about potential risk of taking hydrochlorothiazide particular if he has undiagnosed renal impairment or hypokalemia.  It would be safer to have him on a higher dose doxazosin and discontinue the hydrochlorothiazide altogether.  Mother is willing to make this change.  He is due for a flu shot and COVID shot however she declines this.  Last year at this time he was 400 pounds.  Today he is 393.  Mom has stopped giving him sodas and Kool-Aid and juice.  He is now drinking water.  However he continues to eat large quantities of carbohydrates especially Posta.  She denies any cakes or cookies.  He is also sedentary.  Past Medical History:  Diagnosis Date   Autism    Hypertension    Obesity    Tourette disorder    Current Outpatient Medications on File Prior to Visit  Medication Sig Dispense Refill   amLODipine (NORVASC) 10 MG tablet Take 1 tablet (10 mg total) by mouth daily. 90 tablet 3   cloNIDine (CATAPRES) 0.2 MG tablet Take 1 tablet (0.2 mg total) by mouth 2 (two) times daily. 180 tablet 3   diazepam (VALIUM) 10 MG tablet Take 1 tablet (10 mg total) by mouth every 12 (twelve) hours as needed for anxiety (take 30 minutes prior to labs). 10 tablet 0   FANAPT 4 MG TABS tablet Take 4 mg by mouth 2 (two) times daily.     No current facility-administered medications on file prior to visit.    No Known Allergies Social History   Socioeconomic History   Marital status: Single    Spouse name: Not on file   Number of children: Not on file   Years of education: Not on file   Highest education level: Not on file  Occupational History   Not on file  Tobacco Use   Smoking status: Never   Smokeless tobacco: Never  Substance and Sexual Activity   Alcohol use: No   Drug use: No   Sexual activity: Never  Other Topics Concern   Not on file  Social History Narrative   Not on file   Social Determinants of Health   Financial Resource Strain: Not on file  Food Insecurity: Not on file  Transportation Needs: Not on file  Physical Activity: Not on file  Stress: Not on file  Social Connections: Not on file  Intimate Partner Violence: Not on file   Family History  Problem Relation Age of Onset   Hypertension Mother    Diabetes Mother    Hyperlipidemia Mother    Obesity Mother    Hypertension Sister    Hypertension Brother    Diabetes Sister    Obesity Sister       Review of Systems     Objective:   Physical Exam Vitals reviewed.  Constitutional:  General: He is not in acute distress.    Appearance: Normal appearance. He is obese. He is not ill-appearing, toxic-appearing or diaphoretic.  HENT:     Head: Normocephalic and atraumatic.     Right Ear: Tympanic membrane and ear canal normal.     Left Ear: Tympanic membrane and ear canal normal.  Neck:     Vascular: No carotid bruit.  Cardiovascular:     Rate and Rhythm: Normal rate and regular rhythm.     Pulses: Normal pulses.     Heart sounds: Normal heart sounds. No murmur heard.    No friction rub. No gallop.  Pulmonary:     Effort: Pulmonary effort is normal. No respiratory distress.     Breath sounds: Normal breath sounds. No wheezing, rhonchi or rales.  Abdominal:     General: Abdomen is flat. Bowel sounds are normal. There is no distension.     Palpations: Abdomen is soft.     Tenderness: There  is no abdominal tenderness. There is no guarding.  Musculoskeletal:     Cervical back: Neck supple.     Right lower leg: Edema present.     Left lower leg: Edema present.  Lymphadenopathy:     Cervical: No cervical adenopathy.  Neurological:     General: No focal deficit present.     Mental Status: Mental status is at baseline.     Cranial Nerves: No cranial nerve deficit.     Sensory: No sensory deficit.     Motor: No weakness.     Gait: Gait normal.           Assessment & Plan:  General medical exam  Class 3 severe obesity due to excess calories in adult, unspecified BMI, unspecified whether serious comorbidity present (HCC)  Benign essential HTN  Autism Blood pressure is well controlled.  We will discontinue hydrochlorothiazide and compensate with this by increasing doxazosin to 4 mg a day.  Patient will occasionally let his mother check his blood sugar.  I have recommended that she check his blood sugar while fasting.  As long as his fasting blood sugars under 100, there is no sign of diabetes.  If greater than 112 the patient will be a prediabetic and we could consider been trying potentially putting him on Rybelsus both for weight loss and for diabetes.  He would not be willing to try any of the injectable GLP-1 agonist.  Strongly encouraged mother to avoid carbohydrate rich foods such as Posta and other starches.  Recommended a flu shot and COVID shot.  We are unable to check lab work today as stated above

## 2022-02-19 ENCOUNTER — Telehealth: Payer: Self-pay | Admitting: Family Medicine

## 2022-02-19 NOTE — Telephone Encounter (Signed)
Received call from patient's mother to follow up on order for patient's supplies; stated order hasn't been filled because it needs provider's signature.  Please advise at [phone].

## 2022-05-27 ENCOUNTER — Telehealth (INDEPENDENT_AMBULATORY_CARE_PROVIDER_SITE_OTHER): Payer: Medicaid Other | Admitting: Family Medicine

## 2022-05-27 DIAGNOSIS — J069 Acute upper respiratory infection, unspecified: Secondary | ICD-10-CM

## 2022-05-27 NOTE — Progress Notes (Signed)
Virtual Visit via Video note  I connected with the mother of Erik Burton on 05/27/22 at 1527 by video and verified that I am speaking with the correct person using two identifiers. Erik Burton is currently located at home and his mother is currently with him during visit. The provider, Rubie Maid, FNP is located in their home at time of visit.  I discussed the limitations, risks, security and privacy concerns of performing an evaluation and management service by video and the availability of in person appointments. I also discussed with the patient that there may be a patient responsible charge related to this service. The patient expressed understanding and agreed to proceed.  Subjective: PCP: Susy Frizzle, MD  No chief complaint on file.   History is provided by his mother as patient is non-verbal. Complaints include nasal congestion, loss of appetite, fatigue, cough, sore throat for 4 days. Denies rhinorrhea, wheezing, shortness of breath, or fevers. He did have a covid test that may have been positive, his mother saw 2 lines on the test. Unknown if he has been around anyone sick. Has tried nyquil and Mucinex.    ROS: Per HPI  Past Medical History:  Diagnosis Date   Autism    Hypertension    Obesity    Tourette disorder    No past surgical history on file. No Known Allergies   Current Outpatient Medications:    amLODipine (NORVASC) 10 MG tablet, Take 1 tablet (10 mg total) by mouth daily., Disp: 90 tablet, Rfl: 3   cloNIDine (CATAPRES) 0.2 MG tablet, Take 1 tablet (0.2 mg total) by mouth 2 (two) times daily., Disp: 180 tablet, Rfl: 3   diazepam (VALIUM) 10 MG tablet, Take 1 tablet (10 mg total) by mouth every 12 (twelve) hours as needed for anxiety (take 30 minutes prior to labs)., Disp: 10 tablet, Rfl: 0   doxazosin (CARDURA) 4 MG tablet, Take 1 tablet (4 mg total) by mouth daily., Disp: 90 tablet, Rfl: 3   FANAPT 4 MG TABS tablet, Take 4 mg by  mouth 2 (two) times daily., Disp: , Rfl:   Observations/Objective: Physical Exam Constitutional:      Appearance: Normal appearance. He is ill-appearing and diaphoretic.  Pulmonary:     Effort: No respiratory distress.  Skin:    Coloration: Skin is pale.  Neurological:     General: No focal deficit present.     Mental Status: He is alert and oriented to person, place, and time.  Psychiatric:        Mood and Affect: Mood normal.        Behavior: Behavior normal.        Thought Content: Thought content normal.        Judgment: Judgment normal.     Assessment and Plan: Viral URI Assessment & Plan: Patients symptoms as described by his mother are consistent with viral URI. Possible positive covid test. I am concerned however that his mother did report that over the past 4 days he has only consumed a few bites of food and he has to sit down in the floor when he tries to do much physical activity. Given my limited assessment capabilities via virtual visit in addition to the fact he is nonverbal, I recommend he be evaluated in person and suggested she take him to urgent care today.     Follow Up Instructions: No follow-ups on file.   I discussed the assessment and treatment plan with the patient.  The patient was provided an opportunity to ask questions and all were answered. The patient agreed with the plan and demonstrated an understanding of the instructions.   The patient was advised to call back or seek an in-person evaluation if the symptoms worsen or if the condition fails to improve as anticipated.  The above assessment and management plan was discussed with the patient. The patient verbalized understanding of and has agreed to the management plan. Patient is aware to call the clinic if symptoms persist or worsen. Patient is aware when to return to the clinic for a follow-up visit. Patient educated on when it is appropriate to go to the emergency department.   Time call ended:  1538  I provided 11 minutes of face-to-face time during this encounter.   Mila Merry, MSN, APRN, FNP-C [place]

## 2022-05-27 NOTE — Assessment & Plan Note (Signed)
Patients symptoms as described by his mother are consistent with viral URI. Possible positive covid test. I am concerned however that his mother did report that over the past 4 days he has only consumed a few bites of food and he has to sit down in the floor when he tries to do much physical activity. Given my limited assessment capabilities via virtual visit in addition to the fact he is nonverbal, I recommend he be evaluated in person and suggested she take him to urgent care today.

## 2022-09-30 ENCOUNTER — Other Ambulatory Visit: Payer: Self-pay | Admitting: Family Medicine

## 2022-10-01 ENCOUNTER — Other Ambulatory Visit: Payer: Self-pay | Admitting: Family Medicine

## 2022-10-02 NOTE — Telephone Encounter (Signed)
No pharmacy receipt confirmation, will resend medication for refill.  Requested Prescriptions  Pending Prescriptions Disp Refills   amLODipine (NORVASC) 10 MG tablet [Pharmacy Med Name: amLODIPine Besylate 10 MG Oral Tablet] 90 tablet 0    Sig: Take 1 tablet by mouth once daily     Cardiovascular: Calcium Channel Blockers 2 Failed - 10/01/2022  6:51 PM      Failed - Valid encounter within last 6 months    Recent Outpatient Visits           1 year ago Benign essential HTN   [facility] Pickard, Priscille Heidelberg, MD   1 year ago Benign essential HTN   [facility] Pickard, Priscille Heidelberg, MD   1 year ago General medical exam   [facility] Donita Brooks, MD   2 years ago General medical exam   [facility] Donita Brooks, MD   3 years ago Autism   [facility] Pickard, Priscille Heidelberg, MD              Passed - Last BP in normal range    BP Readings from Last 1 Encounters:  12/23/21 126/72         Passed - Last Heart Rate in normal range    Pulse Readings from Last 1 Encounters:  12/23/21 76

## 2022-10-14 ENCOUNTER — Other Ambulatory Visit: Payer: Self-pay | Admitting: Family Medicine

## 2022-10-15 ENCOUNTER — Other Ambulatory Visit: Payer: Self-pay | Admitting: Family Medicine

## 2022-10-16 NOTE — Telephone Encounter (Signed)
Requested Prescriptions  Pending Prescriptions Disp Refills   amLODipine (NORVASC) 10 MG tablet [Pharmacy Med Name: amLODIPine Besylate 10 MG Oral Tablet] 90 tablet 0    Sig: Take 1 tablet by mouth once daily     Cardiovascular: Calcium Channel Blockers 2 Failed - 10/15/2022 10:45 AM      Failed - Valid encounter within last 6 months    Recent Outpatient Visits           1 year ago Benign essential HTN   [facility] Pickard, Priscille Heidelberg, MD   1 year ago Benign essential HTN   [facility] Pickard, Priscille Heidelberg, MD   1 year ago General medical exam   [facility] Donita Brooks, MD   2 years ago General medical exam   [facility] Donita Brooks, MD   3 years ago Autism   [facility] Pickard, Priscille Heidelberg, MD              Passed - Last BP in normal range    BP Readings from Last 1 Encounters:  12/23/21 126/72         Passed - Last Heart Rate in normal range    Pulse Readings from Last 1 Encounters:  12/23/21 76

## 2022-10-20 ENCOUNTER — Other Ambulatory Visit: Payer: Self-pay | Admitting: Family Medicine

## 2022-10-21 ENCOUNTER — Other Ambulatory Visit: Payer: Self-pay | Admitting: Family Medicine

## 2022-10-21 ENCOUNTER — Telehealth: Payer: Self-pay | Admitting: Family Medicine

## 2022-10-21 NOTE — Telephone Encounter (Signed)
Prescription Request  10/21/2022  LOV: 12/23/2021  What is the name of the medication or equipment?   amLODipine (NORVASC) 10 MG tablet  **Pharmacy never received refill request sent on 10/16/22**  Have you contacted your pharmacy to request a refill? Yes   Which pharmacy would you like this sent to?  [id number] - [place] ([place]), [place] - [address] [place] ([place]) [place] [address] Phone: [phone] Fax: [phone]    Patient notified that their request is being sent to the clinical staff for review and that they should receive a response within 2 business days.   Please advise patient at [phone].

## 2022-10-23 ENCOUNTER — Telehealth: Payer: Self-pay

## 2022-10-23 NOTE — Telephone Encounter (Signed)
Called pt's mother, NA and LVM per DPR regard medication refill for Amlodipine already been sent to pharmacy.

## 2022-10-29 ENCOUNTER — Other Ambulatory Visit: Payer: Self-pay

## 2022-10-29 DIAGNOSIS — I10 Essential (primary) hypertension: Secondary | ICD-10-CM

## 2022-10-29 MED ORDER — AMLODIPINE BESYLATE 10 MG PO TABS: 10.0000 mg | ORAL_TABLET | Freq: Every day | ORAL | 0 refills | Status: DC

## 2022-10-29 NOTE — Telephone Encounter (Signed)
Patient's mom called to follow up on refill request.   Please advise with update at [phone]

## 2022-10-29 NOTE — Telephone Encounter (Signed)
Patient's mother stated original refill request denied. Please see previous messages in thread.

## 2022-11-03 ENCOUNTER — Other Ambulatory Visit: Payer: Self-pay | Admitting: Family Medicine

## 2022-11-03 ENCOUNTER — Other Ambulatory Visit: Payer: Self-pay

## 2022-11-03 DIAGNOSIS — I10 Essential (primary) hypertension: Secondary | ICD-10-CM

## 2022-11-03 MED ORDER — AMLODIPINE BESYLATE 10 MG PO TABS
10.0000 mg | ORAL_TABLET | Freq: Every day | ORAL | 0 refills | Status: DC
Start: 2022-11-03 — End: 2022-12-25

## 2022-11-03 MED ORDER — AMLODIPINE BESYLATE 10 MG PO TABS: 10.0000 mg | ORAL_TABLET | Freq: Every day | ORAL | 0 refills | Status: DC

## 2022-12-07 ENCOUNTER — Telehealth: Payer: Self-pay | Admitting: Family Medicine

## 2022-12-07 NOTE — Telephone Encounter (Signed)
Patient's mother called to request a script for pull ups for the patient throuth [employer]; patient no longer using [facility] (they no longer supply pull-ups).  Please advise at [phone].

## 2022-12-11 ENCOUNTER — Other Ambulatory Visit: Payer: Self-pay | Admitting: Family Medicine

## 2022-12-11 DIAGNOSIS — I10 Essential (primary) hypertension: Secondary | ICD-10-CM

## 2022-12-14 NOTE — Telephone Encounter (Signed)
Refilled 11/03/22 # 90, too early. Requested Prescriptions  Refused Prescriptions Disp Refills   amLODipine (NORVASC) 10 MG tablet [Pharmacy Med Name: amLODIPine Besylate 10 MG Oral Tablet] 90 tablet 0    Sig: Take 1 tablet by mouth once daily     Cardiovascular: Calcium Channel Blockers 2 Failed - 12/11/2022  5:09 PM      Failed - Valid encounter within last 6 months    Recent Outpatient Visits           1 year ago Benign essential HTN   [facility] Tanya Nones, Priscille Heidelberg, MD   1 year ago Benign essential HTN   [facility] Pickard, Priscille Heidelberg, MD   2 years ago General medical exam   [facility] Donita Brooks, MD   3 years ago General medical exam   [facility] Donita Brooks, MD   3 years ago Autism   [facility] Pickard, Priscille Heidelberg, MD       Future Appointments             In 1 week Pickard, Priscille Heidelberg, MD [facility]            Passed - Last BP in normal range    BP Readings from Last 1 Encounters:  12/23/21 126/72         Passed - Last Heart Rate in normal range    Pulse Readings from Last 1 Encounters:  12/23/21 76

## 2022-12-15 NOTE — Telephone Encounter (Signed)
CPE rescheduled for October (cpe in 2023 was 12/23/21 - needs to be 1 year out). Please see previous note in thread.

## 2022-12-15 NOTE — Telephone Encounter (Signed)
Patient has a CPE scheduled for 12/24/22; pharmacy hasn't received response to refill requested for amLODipine (NORVASC) 10 MG tablet   LOV: 05/27/2022 (MyChart Video Visit  Please advise patient's mom at [phone].

## 2022-12-23 DIAGNOSIS — F0634 Mood disorder due to known physiological condition with mixed features: Secondary | ICD-10-CM | POA: Insufficient documentation

## 2022-12-23 DIAGNOSIS — F71 Moderate intellectual disabilities: Secondary | ICD-10-CM | POA: Insufficient documentation

## 2022-12-24 ENCOUNTER — Encounter: Payer: Medicaid Other | Admitting: Family Medicine

## 2022-12-25 ENCOUNTER — Telehealth: Payer: Self-pay

## 2022-12-25 ENCOUNTER — Other Ambulatory Visit: Payer: Self-pay

## 2022-12-25 DIAGNOSIS — I10 Essential (primary) hypertension: Secondary | ICD-10-CM

## 2022-12-25 MED ORDER — AMLODIPINE BESYLATE 10 MG PO TABS
10.0000 mg | ORAL_TABLET | Freq: Every day | ORAL | 0 refills | Status: DC
Start: 2022-12-25 — End: 2023-02-04

## 2022-12-25 MED ORDER — AMLODIPINE BESYLATE 10 MG PO TABS
10.0000 mg | ORAL_TABLET | Freq: Every day | ORAL | 0 refills | Status: DC
Start: 2022-12-25 — End: 2022-12-25

## 2022-12-25 NOTE — Telephone Encounter (Signed)
Pt's mom called in to inquire about pt's med amLODipine (NORVASC) 10 MG tablet [[id number]]. Pt's mom stated that pt has requested this med 3 weeks ago, and still have not received this med. Please advise.  LOV: 12/23/21 UPCOMING CPE 12/24/22  PHARMACY: [id number] - [address] ([place]), [place] - [address] [place] ([place]) [place] [address] Phone: [phone]  Fax: [phone]    CB: [phone]

## 2022-12-31 ENCOUNTER — Other Ambulatory Visit: Payer: Self-pay | Admitting: Family Medicine

## 2022-12-31 NOTE — Telephone Encounter (Signed)
Requested medications are due for refill today.  yes  Requested medications are on the active medications list.  yes  Last refill. 12/23/2021 #90 3 rf  Future visit scheduled.   yes  Notes to clinic.  Pt is more than 3 months overdue for ov.    Requested Prescriptions  Pending Prescriptions Disp Refills   doxazosin (CARDURA) 4 MG tablet [Pharmacy Med Name: Doxazosin Mesylate 4 MG Oral Tablet] 90 tablet 0    Sig: Take 1 tablet by mouth once daily     Cardiovascular:  Alpha Blockers Failed - 12/31/2022 12:04 PM      Failed - Valid encounter within last 6 months    Recent Outpatient Visits           1 year ago Benign essential HTN   [facility] Tanya Nones, Priscille Heidelberg, MD   1 year ago Benign essential HTN   [facility] Tanya Nones, Priscille Heidelberg, MD   2 years ago General medical exam   [facility] Donita Brooks, MD   3 years ago General medical exam   [facility] Donita Brooks, MD   3 years ago Autism   [facility] Pickard, Priscille Heidelberg, MD       Future Appointments             In 1 month Pickard, Priscille Heidelberg, MD [facility]            Passed - Last BP in normal range    BP Readings from Last 1 Encounters:  12/23/21 126/72

## 2023-02-02 ENCOUNTER — Encounter: Payer: Medicaid Other | Admitting: Family Medicine

## 2023-02-04 ENCOUNTER — Ambulatory Visit (INDEPENDENT_AMBULATORY_CARE_PROVIDER_SITE_OTHER): Payer: MEDICAID | Admitting: Family Medicine

## 2023-02-04 DIAGNOSIS — I10 Essential (primary) hypertension: Secondary | ICD-10-CM | POA: Diagnosis not present

## 2023-02-04 MED ORDER — DOXAZOSIN MESYLATE 4 MG PO TABS: 4.0000 mg | ORAL_TABLET | Freq: Every day | ORAL | 3 refills | Status: DC

## 2023-02-04 MED ORDER — CLONIDINE HCL 0.1 MG PO TABS: 0.1000 mg | ORAL_TABLET | Freq: Two times a day (BID) | ORAL | 11 refills | Status: DC

## 2023-02-04 MED ORDER — AMLODIPINE BESYLATE 10 MG PO TABS: 10.0000 mg | ORAL_TABLET | Freq: Every day | ORAL | 3 refills | Status: DC

## 2023-02-04 NOTE — Progress Notes (Signed)
Subjective:    Patient ID: Erik Burton, male    DOB: January 30, 1991, 32 y.o.   MRN: [id number]  HPI Mom is here today to recheck his blood pressure.  He has been taking clonidine 1 pill in the morning and 2 pills at night along with amlodipine over the last several weeks.  However he has been out of the doxazosin for more than a month.  Mom has not been checking his blood pressure.  She denies any issues however.  He is unable to provide any history.  I attempted to check his blood sugar today however he grabbed the lab technicians arm and would not allow her to check his blood sugar.  Therefore we did not press the issue.  The patient is very large and strong and gets aggressive anytime we attempt to draw blood work.  Mom denies any polyuria or polydipsia.  Unfortunately he has gained more weight.  However his blood pressure today is good  Past Medical History:  Diagnosis Date   Autism    Hypertension    Obesity    Tourette disorder    Current Outpatient Medications on File Prior to Visit  Medication Sig Dispense Refill   amLODipine (NORVASC) 10 MG tablet Take 1 tablet (10 mg total) by mouth daily. 90 tablet 0   cloNIDine (CATAPRES) 0.2 MG tablet Take 1 tablet (0.2 mg total) by mouth 2 (two) times daily. 180 tablet 3   diazepam (VALIUM) 10 MG tablet Take 1 tablet (10 mg total) by mouth every 12 (twelve) hours as needed for anxiety (take 30 minutes prior to labs). 10 tablet 0   doxazosin (CARDURA) 4 MG tablet Take 1 tablet (4 mg total) by mouth daily. 90 tablet 3   FANAPT 4 MG TABS tablet Take 4 mg by mouth 2 (two) times daily.     No current facility-administered medications on file prior to visit.   No Known Allergies Social History   Socioeconomic History   Marital status: Single    Spouse name: Not on file   Number of children: Not on file   Years of education: Not on file   Highest education level: Not on file  Occupational History   Not on file  Tobacco Use   Smoking  status: Never   Smokeless tobacco: Never  Substance and Sexual Activity   Alcohol use: No   Drug use: No   Sexual activity: Never  Other Topics Concern   Not on file  Social History Narrative   Not on file   Social Determinants of Health   Financial Resource Strain: Not on file  Food Insecurity: Not on file  Transportation Needs: Not on file  Physical Activity: Not on file  Stress: Not on file  Social Connections: Not on file  Intimate Partner Violence: Not on file      Review of Systems  All other systems reviewed and are negative.      Objective:   Physical Exam Vitals reviewed.  Cardiovascular:     Rate and Rhythm: Normal rate and regular rhythm.     Heart sounds: Normal heart sounds.  Pulmonary:     Effort: Pulmonary effort is normal.     Breath sounds: Normal breath sounds.  Musculoskeletal:     Right lower leg: No edema.     Left lower leg: No edema.  Neurological:     Mental Status: He is alert.          Assessment & Plan:  Class 3 severe obesity due to excess calories in adult, unspecified BMI, unspecified whether serious comorbidity present (HCC) - Plan: amLODipine (NORVASC) 10 MG tablet, Glucose, fingerstick (stat)  Benign essential HTN - Plan: amLODipine (NORVASC) 10 MG tablet, Glucose, fingerstick (stat) We will continue to prescribe clonidine and amlodipine.  I am using these 2 medications because they do not require lab work to monitor for any toxicity.  Obviously I would like to check lab work on this patient however it is impossible to draw labs in our clinic.  He will not physically allow the lab technician to draw blood.  He is far too big to try to hold down and force the issue.  Therefore, through shared decision making, we have decided not to draw blood work.  I did refill his doxazosin but I told his mother not to start giving him the medication unless she states his blood pressure running higher than 140/90 at home.  She will let me know and  start checking it on a daily basis

## 2023-02-05 ENCOUNTER — Ambulatory Visit: Payer: MEDICAID | Admitting: Family Medicine

## 2023-04-06 ENCOUNTER — Encounter: Payer: Medicaid Other | Admitting: Family Medicine

## 2023-04-22 ENCOUNTER — Encounter: Payer: Self-pay | Admitting: Family Medicine

## 2023-04-22 ENCOUNTER — Ambulatory Visit: Payer: MEDICAID | Admitting: Family Medicine

## 2023-04-22 VITALS — BP 132/78 | HR 86 | Ht 76.0 in | Wt >= 6400 oz

## 2023-04-22 DIAGNOSIS — Z0001 Encounter for general adult medical examination with abnormal findings: Secondary | ICD-10-CM | POA: Diagnosis not present

## 2023-04-22 DIAGNOSIS — Z0000 Encounter for general adult medical examination without abnormal findings: Secondary | ICD-10-CM

## 2023-04-22 DIAGNOSIS — H60311 Diffuse otitis externa, right ear: Secondary | ICD-10-CM | POA: Diagnosis not present

## 2023-04-22 MED ORDER — AMOXICILLIN-POT CLAVULANATE 875-125 MG PO TABS: 1.0000 | ORAL_TABLET | Freq: Two times a day (BID) | ORAL | 0 refills | Status: AC

## 2023-04-22 MED ORDER — NEOMYCIN-POLYMYXIN-HC 3.5-10000-1 OT SOLN: 3.0000 [drp] | Freq: Four times a day (QID) | OTIC | 0 refills | Status: AC

## 2023-04-22 NOTE — Progress Notes (Signed)
 Wt Readings from Last 3 Encounters:  04/22/23 (!) 415 lb (188.2 kg)  02/04/23 (!) 404 lb 3.2 oz (183.3 kg)  12/23/21 (!) 393 lb (178.3 kg)     Subjective:    Patient ID: Erik Burton, male    DOB: 1990/08/01, 33 y.o.   MRN: [id number]  HPI Patient is a 33 year old African-American gentleman here today with his mother.  He has a history of severe autism.  He is noncommunicative.  He is currently on amlodipine , doxazosin , and hydrochlorothiazide  for hypertension.  His blood pressure today is well controlled at 132/78.  Due to his large size strength and severe autism, we have been unable to check lab work as the patient is very afraid of needles.  Since his physical exam last year, the patient has gained 22 pounds.  He now weighs 416 pounds.  Mom admits that he is drinking sodas and Kool-Aid.  He also eats a diet rich in carbohydrates including potato chips, bread, pasta.  He is getting to walk 10 or 15 minutes a day with his caregiver but that is the only exercise that he gets. Past Medical History:  Diagnosis Date   Autism    Hypertension    Obesity    Tourette disorder    Current Outpatient Medications on File Prior to Visit  Medication Sig Dispense Refill   amLODipine  (NORVASC ) 10 MG tablet Take 1 tablet (10 mg total) by mouth daily. 90 tablet 3   cloNIDine  (CATAPRES ) 0.1 MG tablet Take 1 tablet (0.1 mg total) by mouth 2 (two) times daily. Pt is taking one in AM and two in PM. 60 tablet 11   diazepam  (VALIUM ) 10 MG tablet Take 1 tablet (10 mg total) by mouth every 12 (twelve) hours as needed for anxiety (take 30 minutes prior to labs). 10 tablet 0   doxazosin  (CARDURA ) 4 MG tablet Take 1 tablet (4 mg total) by mouth daily. 90 tablet 3   FANAPT 4 MG TABS tablet Take 4 mg by mouth 2 (two) times daily.     No current facility-administered medications on file prior to visit.   No Known Allergies Social History   Socioeconomic History   Marital status: Single    Spouse name: Not  on file   Number of children: Not on file   Years of education: Not on file   Highest education level: Not on file  Occupational History   Not on file  Tobacco Use   Smoking status: Never   Smokeless tobacco: Never  Substance and Sexual Activity   Alcohol use: No   Drug use: No   Sexual activity: Never  Other Topics Concern   Not on file  Social History Narrative   Not on file   Social Drivers of Health   Financial Resource Strain: Not on file  Food Insecurity: Not on file  Transportation Needs: Not on file  Physical Activity: Not on file  Stress: Not on file  Social Connections: Not on file  Intimate Partner Violence: Not on file   Family History  Problem Relation Age of Onset   Hypertension Mother    Diabetes Mother    Hyperlipidemia Mother    Obesity Mother    Hypertension Sister    Hypertension Brother    Diabetes Sister    Obesity Sister       Review of Systems     Objective:   Physical Exam Vitals reviewed.  Constitutional:      General: He is not  in acute distress.    Appearance: Normal appearance. He is obese. He is not ill-appearing, toxic-appearing or diaphoretic.  HENT:     Head: Normocephalic and atraumatic.     Right Ear: Drainage and swelling present.     Left Ear: Tympanic membrane and ear canal normal.  Neck:     Vascular: No carotid bruit.  Cardiovascular:     Rate and Rhythm: Normal rate and regular rhythm.     Pulses: Normal pulses.     Heart sounds: Normal heart sounds. No murmur heard.    No friction rub. No gallop.  Pulmonary:     Effort: Pulmonary effort is normal. No respiratory distress.     Breath sounds: Normal breath sounds. No wheezing, rhonchi or rales.  Abdominal:     General: Abdomen is flat. Bowel sounds are normal. There is no distension.     Palpations: Abdomen is soft.     Tenderness: There is no abdominal tenderness. There is no guarding.  Musculoskeletal:     Cervical back: Neck supple.     Right lower leg:  Edema present.     Left lower leg: Edema present.  Lymphadenopathy:     Cervical: No cervical adenopathy.  Neurological:     General: No focal deficit present.     Mental Status: Mental status is at baseline.     Cranial Nerves: No cranial nerve deficit.     Sensory: No sensory deficit.     Motor: No weakness.     Gait: Gait normal.    White pus is coming out of the patient's right auditory canal.  The entire canal is filled with white exudate completely obscuring my view of the tympanic membrane       Assessment & Plan:  General medical exam  Morbid obesity (HCC)  Acute diffuse otitis externa of right ear Again, I had a long discussion with the patient's mother about his severe obesity.  This puts him at high risk for developing type 2 diabetes and heart problems.  Furthermore I am unable to check lab work due to the patient's size and strength.  We are physically unable to hold his arm still to check blood work.  We have tried in the past and my staff is unable to even get a fingerstick blood sugar.  His blood pressure is well-controlled today.  I will not make any changes in his antihypertensives but I have begged his mother to stop giving him juice and sodas.  They need to at least switch to 0-calorie alternatives.  They also need to decrease consumption of carbohydrates in the form of bread, pasta, rice, as well as junk foods such as potato chips and cookies.  Treat otitis externa with Cortisporin HC otic 3 drops 4 times daily.  Mom is not certain that she can get the drops to stay in his ear.  Therefore I will start the patient on Augmentin  875 mg twice daily for 10 days and I would like to see the patient back in 1 week to reassess for improvement in the infection.  Mom declines any vaccinations today due to the patient's fear of needles

## 2023-05-06 ENCOUNTER — Ambulatory Visit: Payer: MEDICAID | Admitting: Family Medicine

## 2023-05-06 VITALS — BP 124/82 | HR 88 | Temp 98.5°F | Ht 76.0 in | Wt >= 6400 oz

## 2023-05-06 DIAGNOSIS — H60331 Swimmer's ear, right ear: Secondary | ICD-10-CM | POA: Diagnosis not present

## 2023-05-06 NOTE — Progress Notes (Signed)
Wt Readings from Last 3 Encounters:  05/06/23 (!) 408 lb (185.1 kg)  04/22/23 (!) 415 lb (188.2 kg)  02/04/23 (!) 404 lb 3.2 oz (183.3 kg)     Subjective:    Patient ID: Erik Burton, male    DOB: 10-21-1990, 33 y.o.   MRN: [id number]  HPI I saw the patient 2 weeks ago for a physical.  At that time his entire right auditory canal was filled with purulent white exudate.  The patient appeared to have severe otitis externa.  I started him on Augmentin coupled with Cortisporin HC's.  All the swelling and drainage is somewhat better, the auditory canal is still filled with a purulent exudate.  Patient is still scratching and pulling at his ear. Past Medical History:  Diagnosis Date   Autism    Hypertension    Obesity    Tourette disorder    Current Outpatient Medications on File Prior to Visit  Medication Sig Dispense Refill   amLODipine (NORVASC) 10 MG tablet Take 1 tablet (10 mg total) by mouth daily. 90 tablet 3   cloNIDine (CATAPRES) 0.1 MG tablet Take 1 tablet (0.1 mg total) by mouth 2 (two) times daily. Pt is taking one in AM and two in PM. 60 tablet 11   diazepam (VALIUM) 10 MG tablet Take 1 tablet (10 mg total) by mouth every 12 (twelve) hours as needed for anxiety (take 30 minutes prior to labs). 10 tablet 0   doxazosin (CARDURA) 4 MG tablet Take 1 tablet (4 mg total) by mouth daily. 90 tablet 3   FANAPT 4 MG TABS tablet Take 4 mg by mouth 2 (two) times daily.     neomycin-polymyxin-hydrocortisone (CORTISPORIN) OTIC solution Place 3 drops into the right ear 4 (four) times daily. 10 mL 0   amoxicillin-clavulanate (AUGMENTIN) 875-125 MG tablet Take 1 tablet by mouth 2 (two) times daily. (Patient not taking: Reported on 05/06/2023) 20 tablet 0   No current facility-administered medications on file prior to visit.   No Known Allergies Social History   Socioeconomic History   Marital status: Single    Spouse name: Not on file   Number of children: Not on file   Years of  education: Not on file   Highest education level: Not on file  Occupational History   Not on file  Tobacco Use   Smoking status: Never   Smokeless tobacco: Never  Substance and Sexual Activity   Alcohol use: No   Drug use: No   Sexual activity: Never  Other Topics Concern   Not on file  Social History Narrative   Not on file   Social Drivers of Health   Financial Resource Strain: Not on file  Food Insecurity: Not on file  Transportation Needs: Not on file  Physical Activity: Not on file  Stress: Not on file  Social Connections: Not on file  Intimate Partner Violence: Not on file   Family History  Problem Relation Age of Onset   Hypertension Mother    Diabetes Mother    Hyperlipidemia Mother    Obesity Mother    Hypertension Sister    Hypertension Brother    Diabetes Sister    Obesity Sister       Review of Systems     Objective:   Physical Exam Vitals reviewed.  Constitutional:      General: He is not in acute distress.    Appearance: Normal appearance. He is obese. He is not ill-appearing, toxic-appearing or diaphoretic.  HENT:     Head: Normocephalic and atraumatic.     Right Ear: Drainage and swelling present.     Left Ear: Tympanic membrane and ear canal normal.  Neck:     Vascular: No carotid bruit.  Cardiovascular:     Rate and Rhythm: Normal rate and regular rhythm.     Pulses: Normal pulses.     Heart sounds: Normal heart sounds. No murmur heard.    No friction rub. No gallop.  Pulmonary:     Effort: Pulmonary effort is normal. No respiratory distress.     Breath sounds: Normal breath sounds. No wheezing, rhonchi or rales.  Abdominal:     General: Abdomen is flat. Bowel sounds are normal. There is no distension.     Palpations: Abdomen is soft.     Tenderness: There is no abdominal tenderness. There is no guarding.  Musculoskeletal:     Cervical back: Neck supple.     Right lower leg: Edema present.     Left lower leg: Edema present.   Lymphadenopathy:     Cervical: No cervical adenopathy.  Neurological:     General: No focal deficit present.     Mental Status: Mental status is at baseline.     Cranial Nerves: No cranial nerve deficit.     Sensory: No sensory deficit.     Motor: No weakness.     Gait: Gait normal.    White pus is coming out of the patient's right auditory canal.  The entire canal is filled with white exudate completely obscuring my view of the tympanic membrane       Assessment & Plan:  Acute swimmer's ear of right side - Plan: Ambulatory referral to ENT Patient has tried and failed Cortisporin HC otic along with Augmentin.  I recommended a referral to ENT for a desiccant powder to be placed into the ear to try to more effectively treat otitis externa.

## 2023-06-03 ENCOUNTER — Telehealth (INDEPENDENT_AMBULATORY_CARE_PROVIDER_SITE_OTHER): Payer: MEDICAID | Admitting: Family Medicine

## 2023-06-03 DIAGNOSIS — J0110 Acute frontal sinusitis, unspecified: Secondary | ICD-10-CM

## 2023-06-03 MED ORDER — AMOXICILLIN 875 MG PO TABS: 875.0000 mg | ORAL_TABLET | Freq: Two times a day (BID) | ORAL | 0 refills | Status: AC

## 2023-06-03 NOTE — Progress Notes (Signed)
Subjective:    Patient ID: Erik Burton, male    DOB: [birth year], 33 y.o.   MRN: [id number]  HPI Patient is being seen today as a video visit.  Video call began at 228.  Video call concluded at 237.  Mother consents to conduct this visit through video call.  Patient is unable to contribute to the history because of his severe autism.  He is noncommunicative. Patient has been sick for approximately 1 week.  Mother states that he is not eating well.  She reports a lot of head congestion.  There is no runny nose.  She states that he is stopped up and having a hard time breathing through his nose.  He has very little cough.  She looked in his throat today it is not red.  There is no lymphadenopathy in the neck.  However he seems to be having headaches.  He is tender to palpation on his forehead.  When she palpates his forehead it seems to elicit pain.  Mother denies nausea and vomiting.  She is unable to check his temperature.  However she states that he does not feel warm. Past Medical History:  Diagnosis Date   Autism    Hypertension    Obesity    Tourette disorder    No past surgical history on file. Current Outpatient Medications on File Prior to Visit  Medication Sig Dispense Refill   amLODipine (NORVASC) 10 MG tablet Take 1 tablet (10 mg total) by mouth daily. 90 tablet 3   amoxicillin-clavulanate (AUGMENTIN) 875-125 MG tablet Take 1 tablet by mouth 2 (two) times daily. (Patient not taking: Reported on 05/06/2023) 20 tablet 0   cloNIDine (CATAPRES) 0.1 MG tablet Take 1 tablet (0.1 mg total) by mouth 2 (two) times daily. Pt is taking one in AM and two in PM. 60 tablet 11   diazepam (VALIUM) 10 MG tablet Take 1 tablet (10 mg total) by mouth every 12 (twelve) hours as needed for anxiety (take 30 minutes prior to labs). 10 tablet 0   doxazosin (CARDURA) 4 MG tablet Take 1 tablet (4 mg total) by mouth daily. 90 tablet 3   FANAPT 4 MG TABS tablet Take 4 mg by mouth 2 (two) times daily.      neomycin-polymyxin-hydrocortisone (CORTISPORIN) OTIC solution Place 3 drops into the right ear 4 (four) times daily. 10 mL 0   No current facility-administered medications on file prior to visit.   No Known Allergies Social History   Socioeconomic History   Marital status: Single    Spouse name: Not on file   Number of children: Not on file   Years of education: Not on file   Highest education level: Not on file  Occupational History   Not on file  Tobacco Use   Smoking status: Never   Smokeless tobacco: Never  Substance and Sexual Activity   Alcohol use: No   Drug use: No   Sexual activity: Never  Other Topics Concern   Not on file  Social History Narrative   Not on file   Social Drivers of Health   Financial Resource Strain: Not on file  Food Insecurity: Not on file  Transportation Needs: Not on file  Physical Activity: Not on file  Stress: Not on file  Social Connections: Not on file  Intimate Partner Violence: Not on file     Review of Systems  All other systems reviewed and are negative.      Objective:   Physical  Exam  Unable to perform a physical exam as this is a video visit however the patient does not appear toxic or ill      Assessment & Plan:  Acute frontal sinusitis, recurrence not specified Patient appears to have had a viral upper respiratory infection for the last several days.  However he appears to be suffering now from a severe headache and not eating.  Therefore my treat the patient for possible sinusitis with amoxicillin 875 mg twice daily for 10 days.  I instructed the mother that if he is not improving I need to see him in the office so that I can examine him myself.

## 2023-06-08 ENCOUNTER — Ambulatory Visit: Payer: MEDICAID | Admitting: Family Medicine

## 2023-06-14 ENCOUNTER — Telehealth (INDEPENDENT_AMBULATORY_CARE_PROVIDER_SITE_OTHER): Payer: Self-pay | Admitting: Otolaryngology

## 2023-06-14 NOTE — Telephone Encounter (Signed)
 LVM to confirm appt & location [id number] afm

## 2023-06-15 ENCOUNTER — Institutional Professional Consult (permissible substitution) (INDEPENDENT_AMBULATORY_CARE_PROVIDER_SITE_OTHER): Payer: MEDICAID

## 2023-08-25 ENCOUNTER — Telehealth (INDEPENDENT_AMBULATORY_CARE_PROVIDER_SITE_OTHER): Payer: Self-pay | Admitting: Otolaryngology

## 2023-08-25 NOTE — Telephone Encounter (Signed)
 Left vm to confirm location for appt on 08/26/2023.

## 2023-08-26 ENCOUNTER — Institutional Professional Consult (permissible substitution) (INDEPENDENT_AMBULATORY_CARE_PROVIDER_SITE_OTHER): Payer: MEDICAID | Admitting: Otolaryngology

## 2024-01-27 ENCOUNTER — Telehealth: Payer: Self-pay

## 2024-01-27 NOTE — Telephone Encounter (Signed)
 Copied from CRM [id number]. Topic: General - Other >> Jan 27, 2024 12:50 PM Joesph NOVAK wrote: Reason for CRM: Care manager calling from [facility] stating Erik Burton recently moved in a residential facility. Would like to get his depends switched over to be delivered to [address] [place] [address]. Needs provider approval.  [phone]

## 2024-04-11 ENCOUNTER — Telehealth: Payer: Self-pay

## 2024-04-11 ENCOUNTER — Telehealth: Payer: Self-pay | Admitting: Family Medicine

## 2024-04-11 ENCOUNTER — Other Ambulatory Visit: Payer: Self-pay | Admitting: Family Medicine

## 2024-04-11 DIAGNOSIS — E66813 Obesity, class 3: Secondary | ICD-10-CM

## 2024-04-11 DIAGNOSIS — I10 Essential (primary) hypertension: Secondary | ICD-10-CM

## 2024-04-11 NOTE — Telephone Encounter (Signed)
 Copied from CRM #[id number]. Topic: Clinical - Order For Equipment >> Apr 11, 2024  2:39 PM Amber H wrote: Reason for CRM: Reason for CRM:  Tia from [facility] called and stated [facility] is needing a refill for patients Depend pull ups. He only has 1 more day left of supplies.   [facility]- [phone]

## 2024-04-11 NOTE — Telephone Encounter (Signed)
 Copied from CRM [id number]. Topic: Clinical - Medication Refill >> Apr 11, 2024  2:06 PM Amy B wrote: Medication: amLODipine  (NORVASC ) 10 MG tablet  cloNIDine  (CATAPRES ) 0.1 MG tablet doxazosin  (CARDURA ) 4 MG tablet  Has the patient contacted their pharmacy? Yes (Agent: If no, request that the patient contact the pharmacy for the refill. If patient does not wish to contact the pharmacy document the reason why and proceed with request.) (Agent: If yes, when and what did the pharmacy advise?)  This is the patient's preferred pharmacy:  [id number] - [place] ([place]), [place] - [address] [place] ([place]) [place] [address] Phone: [phone] Fax: [phone]  Is this the correct pharmacy for this prescription? Yes If no, delete pharmacy and type the correct one.   Has the prescription been filled recently? No  Is the patient out of the medication? Yes  Has the patient been seen for an appointment in the last year OR does the patient have an upcoming appointment? Yes  Can we respond through MyChart? Yes  Agent: Please be advised that Rx refills may take up to 3 business days. We ask that you follow-up with your pharmacy.

## 2024-04-12 MED ORDER — AMLODIPINE BESYLATE 10 MG PO TABS: 10.0000 mg | ORAL_TABLET | Freq: Every day | ORAL | 0 refills | Status: AC

## 2024-04-12 MED ORDER — CLONIDINE HCL 0.1 MG PO TABS: 0.1000 mg | ORAL_TABLET | Freq: Two times a day (BID) | ORAL | 0 refills | Status: AC

## 2024-04-12 MED ORDER — DOXAZOSIN MESYLATE 4 MG PO TABS: 4.0000 mg | ORAL_TABLET | Freq: Every day | ORAL | 0 refills | Status: AC

## 2024-04-12 NOTE — Telephone Encounter (Signed)
 Needs appointment last OV 04/22/23 Courtesy 30 day refill given Requested Prescriptions  Pending Prescriptions Disp Refills   amLODipine  (NORVASC ) 10 MG tablet 90 tablet 3    Sig: Take 1 tablet (10 mg total) by mouth daily.     Cardiovascular: Calcium Channel Blockers 2 Failed - 04/12/2024  3:48 PM      Failed - Valid encounter within last 6 months    Recent Outpatient Visits           10 months ago Acute frontal sinusitis, recurrence not specified   [place] [facility] Duanne, Butler DASEN, MD   11 months ago Acute swimmer's ear of right side    [place] [facility] Pickard, Butler DASEN, MD   11 months ago General medical exam   [place] [facility] Duanne Butler DASEN, MD   1 year ago Class 3 severe obesity due to excess calories in adult, unspecified BMI, unspecified whether serious comorbidity present [facility])   [place] [facility] Duanne Butler DASEN, MD   1 year ago Viral URI   [place] [facility] Kayla Jeoffrey RAMAN, FNP              Passed - Last BP in normal range    BP Readings from Last 1 Encounters:  05/06/23 124/82         Passed - Last Heart Rate in normal range    Pulse Readings from Last 1 Encounters:  05/06/23 88          cloNIDine  (CATAPRES ) 0.1 MG tablet 60 tablet 11    Sig: Take 1 tablet (0.1 mg total) by mouth 2 (two) times daily. Pt is taking one in AM and two in PM.     Cardiovascular:  Alpha-2 Agonists Failed - 04/12/2024  3:48 PM      Failed - Valid encounter within last 6 months    Recent Outpatient Visits           10 months ago Acute frontal sinusitis, recurrence not specified   [place] [facility] Duanne, Butler DASEN, MD   11 months ago Acute swimmer's ear of right side   [place] [facility] Pickard, Butler DASEN, MD   11 months ago General medical exam   [place] [facility] Duanne Butler DASEN, MD    1 year ago Class 3 severe obesity due to excess calories in adult, unspecified BMI, unspecified whether serious comorbidity present [facility])   [place] [facility] Duanne Butler DASEN, MD   1 year ago Viral URI   [place] [facility] Medicine Kayla Jeoffrey RAMAN, FNP              Passed - Last BP in normal range    BP Readings from Last 1 Encounters:  05/06/23 124/82         Passed - Last Heart Rate in normal range    Pulse Readings from Last 1 Encounters:  05/06/23 88          doxazosin  (CARDURA ) 4 MG tablet 90 tablet 3    Sig: Take 1 tablet (4 mg total) by mouth daily.     Cardiovascular:  Alpha Blockers Failed - 04/12/2024  3:48 PM      Failed - Valid encounter within last 6 months    Recent Outpatient Visits  10 months ago Acute frontal sinusitis, recurrence not specified   [place] [facility] Pickard, Butler DASEN, MD   11 months ago Acute swimmer's ear of right side   [place] [facility] Pickard, Butler DASEN, MD   11 months ago General medical exam   [place] [facility] Duanne Butler DASEN, MD   1 year ago Class 3 severe obesity due to excess calories in adult, unspecified BMI, unspecified whether serious comorbidity present [facility])   [place] [facility] Medicine Duanne Butler DASEN, MD   1 year ago Viral URI   [place] [facility] Medicine Kayla Jeoffrey RAMAN, FNP              Passed - Last BP in normal range    BP Readings from Last 1 Encounters:  05/06/23 124/82
